# Patient Record
Sex: Male | Born: 1970 | Race: Black or African American | Hispanic: No | Marital: Single | Smoking: Current every day smoker
Health system: Southern US, Community
[De-identification: ages and names within clinical notes are randomized; demographics above are authoritative.]

## PROBLEM LIST (undated history)

## (undated) DIAGNOSIS — N183 Chronic kidney disease, stage 3 unspecified: Secondary | ICD-10-CM

## (undated) DIAGNOSIS — L409 Psoriasis, unspecified: Secondary | ICD-10-CM

## (undated) DIAGNOSIS — B1920 Unspecified viral hepatitis C without hepatic coma: Secondary | ICD-10-CM

## (undated) DIAGNOSIS — N186 End stage renal disease: Secondary | ICD-10-CM

## (undated) DIAGNOSIS — E785 Hyperlipidemia, unspecified: Secondary | ICD-10-CM

## (undated) DIAGNOSIS — E119 Type 2 diabetes mellitus without complications: Secondary | ICD-10-CM

## (undated) DIAGNOSIS — I10 Essential (primary) hypertension: Secondary | ICD-10-CM

## (undated) DIAGNOSIS — Z992 Dependence on renal dialysis: Secondary | ICD-10-CM

## (undated) DIAGNOSIS — K219 Gastro-esophageal reflux disease without esophagitis: Secondary | ICD-10-CM

## (undated) HISTORY — DX: Chronic kidney disease, stage 3 unspecified: N18.30

## (undated) HISTORY — PX: EYE SURGERY: SHX253

## (undated) HISTORY — DX: Hyperlipidemia, unspecified: E78.5

---

## 2013-12-04 ENCOUNTER — Encounter (HOSPITAL_COMMUNITY): Payer: Self-pay | Admitting: Emergency Medicine

## 2013-12-04 ENCOUNTER — Emergency Department (HOSPITAL_COMMUNITY)
Admission: EM | Admit: 2013-12-04 | Discharge: 2013-12-04 | Disposition: A | Discharge status: Home / Self Care | Payer: Self-pay | Attending: Emergency Medicine | Admitting: Emergency Medicine

## 2013-12-04 DIAGNOSIS — L03211 Cellulitis of face: Secondary | ICD-10-CM | POA: Insufficient documentation

## 2013-12-04 DIAGNOSIS — Z8619 Personal history of other infectious and parasitic diseases: Secondary | ICD-10-CM | POA: Insufficient documentation

## 2013-12-04 DIAGNOSIS — L0201 Cutaneous abscess of face: Secondary | ICD-10-CM | POA: Insufficient documentation

## 2013-12-04 DIAGNOSIS — F17200 Nicotine dependence, unspecified, uncomplicated: Secondary | ICD-10-CM | POA: Insufficient documentation

## 2013-12-04 DIAGNOSIS — I10 Essential (primary) hypertension: Secondary | ICD-10-CM | POA: Insufficient documentation

## 2013-12-04 HISTORY — DX: Essential (primary) hypertension: I10

## 2013-12-04 HISTORY — DX: Unspecified viral hepatitis C without hepatic coma: B19.20

## 2013-12-04 HISTORY — DX: Psoriasis, unspecified: L40.9

## 2013-12-04 MED ORDER — IBUPROFEN 800 MG PO TABS
800.0000 mg | ORAL_TABLET | Freq: Once | ORAL | Status: AC
  Administered 2013-12-04: 800 mg via ORAL
  Filled 2013-12-04: qty 1

## 2013-12-04 MED ORDER — AMOXICILLIN 500 MG PO CAPS: ORAL_CAPSULE | ORAL | Status: DC

## 2013-12-04 MED ORDER — CEFTRIAXONE SODIUM 1 G IJ SOLR
1.0000 g | Freq: Once | INTRAMUSCULAR | Status: AC
  Administered 2013-12-04: 1 g via INTRAMUSCULAR
  Filled 2013-12-04: qty 10

## 2013-12-04 MED ORDER — HYDROCODONE-ACETAMINOPHEN 5-325 MG PO TABS
2.0000 | ORAL_TABLET | Freq: Once | ORAL | Status: AC
  Administered 2013-12-04: 2 via ORAL
  Filled 2013-12-04: qty 2

## 2013-12-04 MED ORDER — LIDOCAINE HCL (PF) 1 % IJ SOLN
INTRAMUSCULAR | Status: AC
  Administered 2013-12-04: 5 mL
  Filled 2013-12-04: qty 5

## 2013-12-04 MED ORDER — LIDOCAINE HCL (PF) 1 % IJ SOLN
INTRAMUSCULAR | Status: AC
  Administered 2013-12-04: 2.1 mL
  Filled 2013-12-04: qty 5

## 2013-12-04 MED ORDER — ONDANSETRON 4 MG PO TBDP
4.0000 mg | ORAL_TABLET | Freq: Once | ORAL | Status: AC
  Administered 2013-12-04: 4 mg via ORAL
  Filled 2013-12-04: qty 1

## 2013-12-04 MED ORDER — SULFAMETHOXAZOLE-TRIMETHOPRIM 800-160 MG PO TABS: 1.0000 | ORAL_TABLET | Freq: Two times a day (BID) | ORAL | Status: DC

## 2013-12-04 MED ORDER — SULFAMETHOXAZOLE-TMP DS 800-160 MG PO TABS
1.0000 | ORAL_TABLET | Freq: Once | ORAL | Status: AC
  Administered 2013-12-04: 1 via ORAL
  Filled 2013-12-04: qty 1

## 2013-12-04 MED ORDER — HYDROCODONE-ACETAMINOPHEN 5-325 MG PO TABS: 1.0000 | ORAL_TABLET | Freq: Four times a day (QID) | ORAL | Status: DC | PRN

## 2013-12-04 NOTE — ED Notes (Signed)
Pt has been self medicating with antibiotics purchased off the street, unsure of antibiotic name or strength

## 2013-12-04 NOTE — ED Notes (Signed)
Pt noticed abscess on lt side of cheek 2 days ago, abscess burst 1 day ago and pt states the abscess has tripled in size over night.

## 2013-12-04 NOTE — ED Notes (Signed)
Pt has golf ball sized abscess to left cheek x 3 days total.  Reports it burst yesterday and then increased in size today.

## 2013-12-04 NOTE — ED Provider Notes (Addendum)
CSN: [id number]     Arrival date & time 12/04/13  [id number] History   First MD Initiated Contact with Patient 12/04/13 [phone]     Chief Complaint  Patient presents with  . Abscess    lt cheek   (Consider location/radiation/quality/duration/timing/severity/associated sxs/prior Treatment) Patient is a 42 y.o. male presenting with abscess. The history is provided by the patient.  Abscess Location:  Face Facial abscess location:  Face Abscess quality: painful and warmth   Abscess quality: not draining   Red streaking: no   Duration:  2 days Progression:  Worsening Pain details:    Quality:  Sharp and tightness   Severity:  Moderate   Duration:  2 days   Progression:  Worsening Chronicity:  New Context: not diabetes, not immunosuppression and not insect bite/sting   Relieved by:  Nothing Worsened by:  Draining/squeezing Associated symptoms: no fever, no nausea and no vomiting   Risk factors: no hx of MRSA   Risk factors comment:  Hepatitis C   Past Medical History  Diagnosis Date  . Hypertension   . Hepatitis C   . Psoriasis    History reviewed. No pertinent past surgical history. History reviewed. No pertinent family history. History  Substance Use Topics  . Smoking status: Current Every Day Smoker -- 0.50 packs/day    Types: Cigarettes  . Smokeless tobacco: Not on file  . Alcohol Use: No    Review of Systems  Constitutional: Negative for fever and activity change.       All ROS Neg except as noted in HPI  HENT: Negative for nosebleeds.   Eyes: Negative for photophobia and discharge.  Respiratory: Negative for cough, shortness of breath and wheezing.   Cardiovascular: Negative for chest pain and palpitations.  Gastrointestinal: Negative for nausea, vomiting, abdominal pain and blood in stool.  Genitourinary: Negative for dysuria, frequency and hematuria.  Musculoskeletal: Negative for arthralgias, back pain and neck pain.  Skin: Negative.   Neurological: Negative for  dizziness, seizures and speech difficulty.  Psychiatric/Behavioral: Negative for hallucinations and confusion.    Allergies  Other  Home Medications  No current outpatient prescriptions on file. BP 135/103  Pulse 80  Temp(Src) 98.2 F (36.8 C) (Oral)  Resp 20  Ht 5\' 11"  (1.803 m)  Wt 225 lb (102.059 kg)  BMI 31.39 kg/m2  SpO2 98% Physical Exam  Nursing note and vitals reviewed. Constitutional: He is oriented to person, place, and time. He appears well-developed and well-nourished.  Non-toxic appearance.  HENT:  Head: Normocephalic.    Right Ear: Tympanic membrane and external ear normal.  Left Ear: Tympanic membrane and external ear normal.  Eyes: EOM and lids are normal. Pupils are equal, round, and reactive to light.  Neck: Normal range of motion. Neck supple. Carotid bruit is not present.  Cardiovascular: Normal rate, regular rhythm, normal heart sounds, intact distal pulses and normal pulses.   Pulmonary/Chest: Breath sounds normal. No respiratory distress.  Abdominal: Soft. Bowel sounds are normal. There is no tenderness. There is no guarding.  Musculoskeletal: Normal range of motion.  Lymphadenopathy:       Head (right side): No submandibular adenopathy present.       Head (left side): No submandibular adenopathy present.    He has no cervical adenopathy.  Neurological: He is alert and oriented to person, place, and time. He has normal strength. No cranial nerve deficit or sensory deficit.  Skin: Skin is warm and dry.  Psychiatric: He has a normal mood and affect.  His speech is normal.    ED Course  INCISION AND DRAINAGE Date/Time: 12/04/2013 10:25 AM Performed by: Lenox Ahr Authorized by: Lenox Ahr Consent: Verbal consent obtained. Risks and benefits: risks, benefits and alternatives were discussed Consent given by: patient Patient understanding: patient states understanding of the procedure being performed Patient identity confirmed: arm  band Time out: Immediately prior to procedure a "time out" was called to verify the correct patient, procedure, equipment, support staff and site/side marked as required. Type: abscess Body area: head/neck Location details: face Anesthesia: local infiltration Local anesthetic: lidocaine 1% without epinephrine Patient sedated: no Scalpel size: 11 Incision type: single straight Complexity: simple Drainage: purulent Drainage amount: copious Wound treatment: wound left open Patient tolerance: Patient tolerated the procedure well with no immediate complications. Comments: Sterile dressing applied by me.   (including critical care time) Labs Review Labs Reviewed - No data to display Imaging Review No results found.  EKG Interpretation   None       MDM  No diagnosis found. **I have reviewed nursing notes, vital signs, and all appropriate lab and imaging results for this patient.*  Blood pressure 135/103. Pt advised to see primary MD or the Health Dept for recheck of the blood pressure. I and D  of the abscess of left face was carried out. Culture of abscess sent to the lab.  Pt placed on amoxil, septra, and norco. Pt to apply warm compresses to the face daily. He will return if any problems arise.  Lenox Ahr, PA-C 12/04/13 [place], [place] 12/17/13 [phone]

## 2013-12-06 LAB — CULTURE, ROUTINE-ABSCESS

## 2013-12-06 NOTE — ED Provider Notes (Signed)
Medical screening examination/treatment/procedure(s) were conducted as a shared visit with non-physician practitioner(s) and myself.  I personally evaluated the patient during the encounter.  EKG Interpretation   None      Left facial abscess. Drained in ER. Home with abx. Some bleeding after I and D controlled with pressure.    Hoy Morn, MD 12/06/13 [phone]

## 2013-12-07 ENCOUNTER — Telehealth (HOSPITAL_COMMUNITY): Payer: Self-pay | Admitting: Emergency Medicine

## 2013-12-07 NOTE — ED Notes (Signed)
Post ED Visit - Positive Culture Follow-up  Culture report reviewed by antimicrobial stewardship pharmacist: []  [place]es [place], Pharm.D., BCPS []  Heide Guile, Pharm.D., BCPS []  Alycia Rossetti, Pharm.D., BCPS []  [place], Pharm.D., BCPS, AAHIVP []  Legrand Como, Pharm.D., BCPS, AAHIVP [x]  Nena Jordan, Pharm.D., BCPS  Positive abscess culture Treated with Sulfa-Trimeth, organism sensitive to the same and no further patient follow-up is required at this time.  Kylie A Holland 12/07/2013, 3:18 PM

## 2013-12-17 NOTE — ED Provider Notes (Signed)
Medical screening examination/treatment/procedure(s) were conducted as a shared visit with non-physician practitioner(s) and myself.  I personally evaluated the patient during the encounter.  EKG Interpretation   None       Please see my other note  Hoy Morn, MD 12/17/13 2104

## 2019-04-17 ENCOUNTER — Emergency Department (HOSPITAL_COMMUNITY)
Admission: EM | Admit: 2019-04-17 | Discharge: 2019-04-17 | Disposition: A | Discharge status: Home / Self Care | Payer: Self-pay | Attending: Emergency Medicine | Admitting: Emergency Medicine

## 2019-04-17 ENCOUNTER — Other Ambulatory Visit: Payer: Self-pay

## 2019-04-17 ENCOUNTER — Encounter (HOSPITAL_COMMUNITY): Payer: Self-pay

## 2019-04-17 DIAGNOSIS — F17210 Nicotine dependence, cigarettes, uncomplicated: Secondary | ICD-10-CM | POA: Insufficient documentation

## 2019-04-17 DIAGNOSIS — I10 Essential (primary) hypertension: Secondary | ICD-10-CM | POA: Insufficient documentation

## 2019-04-17 DIAGNOSIS — E119 Type 2 diabetes mellitus without complications: Secondary | ICD-10-CM | POA: Insufficient documentation

## 2019-04-17 DIAGNOSIS — L02811 Cutaneous abscess of head [any part, except face]: Secondary | ICD-10-CM | POA: Insufficient documentation

## 2019-04-17 DIAGNOSIS — Z7984 Long term (current) use of oral hypoglycemic drugs: Secondary | ICD-10-CM | POA: Insufficient documentation

## 2019-04-17 DIAGNOSIS — L0291 Cutaneous abscess, unspecified: Secondary | ICD-10-CM

## 2019-04-17 HISTORY — DX: Type 2 diabetes mellitus without complications: E11.9

## 2019-04-17 LAB — BASIC METABOLIC PANEL
Anion gap: 13 (ref 5–15)
BUN: 13 mg/dL (ref 6–20)
CO2: 25 mmol/L (ref 22–32)
Calcium: 9 mg/dL (ref 8.9–10.3)
Chloride: 97 mmol/L — ABNORMAL LOW (ref 98–111)
Creatinine, Ser: 0.94 mg/dL (ref 0.61–1.24)
GFR calc Af Amer: >60 mL/min (ref >60)
GFR calc non Af Amer: >60 mL/min (ref >60)
Glucose, Bld: 262 mg/dL — ABNORMAL HIGH (ref 70–99)
Potassium: 3.4 mmol/L — ABNORMAL LOW (ref 3.5–5.1)
Sodium: 135 mmol/L (ref 135–145)

## 2019-04-17 LAB — CBC WITH DIFFERENTIAL/PLATELET
Abs Immature Granulocytes: 0.15 10*3/uL — ABNORMAL HIGH (ref 0.00–0.07)
Basophils Absolute: 0.1 10*3/uL (ref 0.0–0.1)
Basophils Relative: 0 %
Eosinophils Absolute: 0.1 10*3/uL (ref 0.0–0.5)
Eosinophils Relative: 0 %
HCT: 48 % (ref 39.0–52.0)
Hemoglobin: 16.3 g/dL (ref 13.0–17.0)
Immature Granulocytes: 1 %
Lymphocytes Relative: 16 %
Lymphs Abs: 3.9 10*3/uL (ref 0.7–4.0)
MCH: 30.5 pg (ref 26.0–34.0)
MCHC: 34 g/dL (ref 30.0–36.0)
MCV: 89.7 fL (ref 80.0–100.0)
Monocytes Absolute: 1.5 10*3/uL — ABNORMAL HIGH (ref 0.1–1.0)
Monocytes Relative: 6 %
Neutro Abs: 18.2 10*3/uL — ABNORMAL HIGH (ref 1.7–7.7)
Neutrophils Relative %: 77 %
Platelets: 347 10*3/uL (ref 150–400)
RBC: 5.35 MIL/uL (ref 4.22–5.81)
RDW: 13.4 % (ref 11.5–15.5)
WBC: 23.9 10*3/uL — ABNORMAL HIGH (ref 4.0–10.5)
nRBC: 0 % (ref 0.0–0.2)

## 2019-04-17 LAB — URINALYSIS, ROUTINE W REFLEX MICROSCOPIC
Glucose, UA: 50 mg/dL — AB
Ketones, ur: 5 mg/dL — AB
Leukocytes,Ua: NEGATIVE
Nitrite: NEGATIVE
Protein, ur: 100 mg/dL — AB
Specific Gravity, Urine: 1.034 — ABNORMAL HIGH (ref 1.005–1.030)
pH: 5 (ref 5.0–8.0)

## 2019-04-17 MED ORDER — SODIUM CHLORIDE 0.9 % IV BOLUS
1000.0000 mL | Freq: Once | INTRAVENOUS | Status: AC
  Administered 2019-04-17: 11:00:00 1000 mL via INTRAVENOUS

## 2019-04-17 MED ORDER — DOXYCYCLINE HYCLATE 100 MG PO CAPS: 100.0000 mg | ORAL_CAPSULE | Freq: Two times a day (BID) | ORAL | 0 refills | Status: DC

## 2019-04-17 MED ORDER — LIDOCAINE HCL (PF) 1 % IJ SOLN
5.0000 mL | Freq: Once | INTRAMUSCULAR | Status: DC
  Filled 2019-04-17: qty 6

## 2019-04-17 MED ORDER — CEPHALEXIN 500 MG PO CAPS: 500.0000 mg | ORAL_CAPSULE | Freq: Four times a day (QID) | ORAL | 0 refills | Status: DC

## 2019-04-17 NOTE — ED Triage Notes (Signed)
Pt reports abscess to back of head for 8 days. Reports it has drained twice and then returns. Makes neck hurt

## 2019-04-17 NOTE — ED Provider Notes (Signed)
[facility] EMERGENCY DEPARTMENT Provider Note   CSN: [id number] Arrival date & time: 04/17/19  9476    History   Chief Complaint Chief Complaint  Patient presents with  . Abscess    HPI Jesse Nguyen is a 48 y.o. male with a PMH of Diabetes, Hepatitic C, HTN, and Psoriasis presenting with an abscess on the right posterior aspect of his head onset 8 days ago. Patient reports abscess has been draining on its own. Patient reports taking aspirin and ibuprofen with partial relief. Patient reports he took aspirin at 6am today. Patient describes pain as a constant ache. Patient states he has had an abscess on his face years ago.  Patient reports a subjective fever and nausea. Patient denies chills, vomiting, abdominal pain, ear pain, neck pain, or weakness. Patient reports he has been taking his oral diabetes medications as prescribed. Patient reports he does not take any medications of his Hepatitis C. Patient reports he has follow up with care with the health department.     HPI  Past Medical History:  Diagnosis Date  . Diabetes mellitus without complication ([place])   . Hepatitis C   . Hypertension   . Psoriasis     There are no active problems to display for this patient.   Past Surgical History:  Procedure Laterality Date  . EYE SURGERY          Home Medications    Prior to Admission medications   Medication Sig Start Date End Date Taking? Authorizing Provider  metFORMIN (GLUCOPHAGE) 1000 MG tablet Take 1,000 mg by mouth 2 (two) times daily with a meal.   Yes [provider]  cephALEXin (KEFLEX) 500 MG capsule Take 1 capsule (500 mg total) by mouth 4 (four) times daily. 04/17/19   Darlin Drop P, PA-C  doxycycline (VIBRAMYCIN) 100 MG capsule Take 1 capsule (100 mg total) by mouth 2 (two) times daily. 04/17/19   Arville Lime, PA-C    Family History No family history on file.  Social History Social History   Tobacco Use  . Smoking status: Current Every  Day Smoker    Packs/day: 0.50    Types: Cigarettes  Substance Use Topics  . Alcohol use: No  . Drug use: Not Currently    Frequency: 7.0 times per week    Types: Marijuana     Allergies   Other   Review of Systems Review of Systems  Constitutional: Positive for fever. Negative for chills and diaphoresis.  HENT: Negative for ear pain, sore throat and trouble swallowing.   Respiratory: Negative for cough and shortness of breath.   Cardiovascular: Negative for chest pain.  Gastrointestinal: Positive for nausea. Negative for abdominal pain and vomiting.  Endocrine: Negative for polydipsia, polyphagia and polyuria.  Genitourinary: Negative for dysuria and frequency.  Musculoskeletal: Negative for neck pain and neck stiffness.  Skin: Positive for color change and wound. Negative for rash.  Allergic/Immunologic: Positive for immunocompromised state.  Neurological: Negative for weakness and headaches.  Hematological: Negative for adenopathy.   Physical Exam Updated Vital Signs BP (!) 153/101   Pulse 88   Temp 98.2 F (36.8 C) (Oral)   Resp 16   Ht 6' (1.829 m)   Wt 89.4 kg   SpO2 100%   BMI 26.72 kg/m   Physical Exam Vitals signs and nursing note reviewed.  Constitutional:      General: He is not in acute distress.    Appearance: He is well-developed. He is not diaphoretic.  HENT:     Head: Normocephalic and atraumatic.     Right Ear: Tympanic membrane, ear canal and external ear normal.     Left Ear: Tympanic membrane, ear canal and external ear normal.     Nose: Nose normal.     Mouth/Throat:     Mouth: Mucous membranes are moist.  Eyes:     Conjunctiva/sclera: Conjunctivae normal.  Neck:     Musculoskeletal: Normal range of motion. No neck rigidity or muscular tenderness.  Cardiovascular:     Rate and Rhythm: Normal rate and regular rhythm.     Heart sounds: Normal heart sounds. No murmur. No friction rub. No gallop.   Pulmonary:     Effort: Pulmonary effort  is normal. No respiratory distress.     Breath sounds: Normal breath sounds. No wheezing or rales.  Abdominal:     Palpations: Abdomen is soft.     Tenderness: There is no abdominal tenderness.  Musculoskeletal: Normal range of motion.  Skin:    General: Skin is warm.     Findings: Abscess (Abscess noted on the right posterior aspect of head. Abscess is actively draining on exam. Mild tenderness to palpation. Mild surrounding erythema noted. See picture for more details.) and erythema present. No rash.  Neurological:     Mental Status: He is alert.       ED Treatments / Results  Labs (all labs ordered are listed, but only abnormal results are displayed) Labs Reviewed  CBC WITH DIFFERENTIAL/PLATELET - Abnormal; Notable for the following components:      Result Value   WBC 23.9 (*)    Neutro Abs 18.2 (*)    Monocytes Absolute 1.5 (*)    Abs Immature Granulocytes 0.15 (*)    All other components within normal limits  URINALYSIS, ROUTINE W REFLEX MICROSCOPIC - Abnormal; Notable for the following components:   Color, Urine AMBER (*)    Specific Gravity, Urine 1.034 (*)    Glucose, UA 50 (*)    Hgb urine dipstick SMALL (*)    Bilirubin Urine SMALL (*)    Ketones, ur 5 (*)    Protein, ur 100 (*)    Bacteria, UA RARE (*)    All other components within normal limits  BASIC METABOLIC PANEL - Abnormal; Notable for the following components:   Potassium 3.4 (*)    Chloride 97 (*)    Glucose, Bld 262 (*)    All other components within normal limits  URINE CULTURE    EKG None  Radiology No results found.  Procedures .Marland KitchenIncision and Drainage Date/Time: 04/17/2019 11:40 AM Performed by: Arville Lime, PA-C Authorized by: Arville Lime, PA-C   Consent:    Consent obtained:  Verbal   Consent given by:  Patient   Risks discussed:  Bleeding, incomplete drainage, pain and damage to other organs   Alternatives discussed:  No treatment Universal protocol:    Procedure  explained and questions answered to patient or proxy's satisfaction: yes     Relevant documents present and verified: yes     Test results available and properly labeled: yes     Imaging studies available: yes     Required blood products, implants, devices, and special equipment available: yes     Site/side marked: yes     Immediately prior to procedure a time out was called: yes     Patient identity confirmed:  Verbally with patient Location:    Type:  Abscess   Size:  3cm  Location:  Head   Head location:  Scalp Pre-procedure details:    Skin preparation:  Betadine Anesthesia (see MAR for exact dosages):    Anesthesia method:  Local infiltration   Local anesthetic:  Lidocaine 1% w/o epi Procedure type:    Complexity:  Simple Procedure details:    Incision types:  Single straight   Incision depth:  Subcutaneous   Scalpel blade:  11   Wound management:  Probed and deloculated, irrigated with saline and extensive cleaning   Drainage:  Purulent and bloody   Drainage amount:  Moderate   Packing materials:  None Post-procedure details:    Patient tolerance of procedure:  Tolerated well, no immediate complications   (including critical care time)  Medications Ordered in ED Medications  lidocaine (PF) (XYLOCAINE) 1 % injection 5 mL (has no administration in time range)  sodium chloride 0.9 % bolus 1,000 mL (1,000 mLs Intravenous New Bag/Given 04/17/19 1051)     Initial Impression / Assessment and Plan / ED Course  I have reviewed the triage vital signs and the nursing notes.  Pertinent labs & imaging results that were available during my care of the patient were reviewed by me and considered in my medical decision making (see chart for details).  Clinical Course as of Apr 16 1140  Wed Apr 17, 2019  1139 Leukocytosis noted at 23.9.  WBC(!): 23.9 [AH]  1139 BMP remarkable for mild hypokalemia at 3.4, low chloride at 97, and hyperglycemia at 262. No anion gap.   Basic  metabolic panel(!) [AH]    Clinical Course User Index [AH] Arville Lime, PA-C       Patient presents with an abscess. Patient with skin abscess amenable to incision and drainage.  Abscess was not large enough to warrant packing or drain. Labs and vitals reviewed. Will advise a wound recheck in 2 days. Encouraged home warm soaks and flushing.  Mild signs of cellulitis is surrounding skin.  Will prescribe antibiotics due to history of diabetes and leukocytosis. Will d/c to home.   Findings and plan of care discussed with supervising physician Dr. Alvino Chapel.  Final Clinical Impressions(s) / ED Diagnoses   Final diagnoses:  Abscess    ED Discharge Orders         Ordered    doxycycline (VIBRAMYCIN) 100 MG capsule  2 times daily     04/17/19 1137    cephALEXin (KEFLEX) 500 MG capsule  4 times daily     04/17/19 [address] [place], [place] 04/17/19 1142    Davonna Belling, MD 04/17/19 [phone]

## 2019-04-17 NOTE — ED Notes (Signed)
Dried abscessed area noted to back of head

## 2019-04-17 NOTE — Discharge Instructions (Addendum)
You have been seen today for an abscess. Please read and follow all provided instructions.   1. Medications: doxycyline and keflex (antibiotic), usual home medications 2. Treatment: rest, drink plenty of fluids 3. Follow Up: Please follow up with your primary doctor for a wound check in 2 days and for discussion of your diagnoses and further evaluation after today's visit; if you do not have a primary care doctor use the resource guide provided to find one; Please return to the ER for any new or worsening symptoms. Please obtain all of your results from medical records or have your doctors office obtain the results - share them with your doctor - you should be seen at your doctors office. Call today to arrange your follow up.   Take medications as prescribed. Please review all of the medicines and only take them if you do not have an allergy to them. Return to the emergency room for worsening condition or new concerning symptoms. Follow up with your regular doctor. If you don't have a regular doctor use one of the numbers below to establish a primary care doctor.  Please be aware that if you are taking birth control pills, taking other prescriptions, ESPECIALLY ANTIBIOTICS may make the birth control ineffective - if this is the case, either do not engage in sexual activity or use alternative methods of birth control such as condoms until you have finished the medicine and your family doctor says it is OK to restart them. If you are on a blood thinner such as COUMADIN, be aware that any other medicine that you take may cause the coumadin to either work too much, or not enough - you should have your coumadin level rechecked in next 7 days if this is the case.  ?  It is also a possibility that you have an allergic reaction to any of the medicines that you have been prescribed - Everybody reacts differently to medications and while MOST people have no trouble with most medicines, you may have a reaction such  as nausea, vomiting, rash, swelling, shortness of breath. If this is the case, please stop taking the medicine immediately and contact your physician.  ?  You should return to the ER if you develop severe or worsening symptoms.   Emergency Department Resource Guide 1) Find a Doctor and Pay Out of Pocket Although you won't have to find out who is covered by your insurance plan, it is a good idea to ask around and get recommendations. You will then need to call the office and see if the doctor you have chosen will accept you as a new patient and what types of options they offer for patients who are self-pay. Some doctors offer discounts or will set up payment plans for their patients who do not have insurance, but you will need to ask so you aren't surprised when you get to your appointment.  2) Contact Your Local Health Department Not all health departments have doctors that can see patients for sick visits, but many do, so it is worth a call to see if yours does. If you don't know where your local health department is, you can check in your phone book. The CDC also has a tool to help you locate your state's health department, and many state websites also have listings of all of their local health departments.  3) Find a [facility] If your illness is not likely to be very severe or complicated, you may want to try a  walk in clinic. These are popping up all over the country in pharmacies, drugstores, and shopping centers. They're usually staffed by nurse practitioners or physician assistants that have been trained to treat common illnesses and complaints. They're usually fairly quick and inexpensive. However, if you have serious medical issues or chronic medical problems, these are probably not your best option.  No Primary Care Doctor: Call Health Connect at  [phone] - they can help you locate a primary care doctor that  accepts your insurance, provides certain services, etc. Physician Referral  Service- [phone]  Chronic Pain Problems: Organization         Address  Phone   Notes  [facility]  [phone] Patients need to be referred by their primary care doctor.   Medication Assistance: Organization         Address  Phone   Notes  [facility] Medication [facility] [address] [place], [place] [address] [phone] --Must be a resident of [facility] -- Must have NO insurance coverage whatsoever (no Medicaid/ Medicare, etc.) -- The pt. MUST have a primary care doctor that directs their care regularly and follows them in the community   [facility]  [phone]   [employer]  [phone]    Agencies that provide inexpensive medical care: Organization         Address  Phone   Notes  [place]  [phone]   [facility]    [phone]   [facility] [place], [place] [address] [phone]   [place] [address] [place] [phone]   [facility]    [phone]   [facility]    [phone]   [place] [address] [place] [address], [place] Phone:  [phone], Fax:  [phone] Hours of Operation:  9 am - 6 pm, M-F.  Also accepts Medicaid/Medicare and self-pay.  [facility] for [address] [place], [address], [place] Phone: [phone], Fax: [phone]. Hours of Operation:  8:30 am - 5:30 pm, M-F.  Also accepts Medicaid and self-pay.  [facility] [address] [place] Phone: [phone]   [place], [place], [place] [phone], [phone] Mondays & Thursdays: 7-9 AM.  First 15 patients are seen on a first come, first serve basis.    [place] Providers:  Organization         Address  Phone   Notes  [facility] [address], [place] [phone] Also accepts self-pay patients.  [facility] [address], [place]  [phone]   [place], [address], [place] [phone]   [facility] [address], [place] [phone]   Lucianne Lei [address], [place]   [phone] Only accepts [place] [facility] [place] patients after they have their name applied to their card.   Self-Pay (no insurance) in [facility]:  Organization         Address  Phone   Notes  Sickle Cell Patients, [facility] [place] [phone]   [facility] [place] [phone]   [facility] [place]  [address], [address], [place] [phone]   [facility]/Dr. Osei-Bonsu  [address] [place] or [address], [place] [phone] Phone number for both [place] and [place] locations is the same.  [facility] [address] [place] [phone]   [facility] [address], [place] or [address] [phone]   [facility] [address] [place] [phone], phone; [phone], fax Sees patients 1st and 3rd Saturday of every month.  Must not qualify for public or private insurance (i.e. Medicaid, Medicare, [employer] Choice, Veterans' Benefits)  Household income should be no more than 200% of the poverty level The clinic cannot treat you if you are pregnant or think you are pregnant  Sexually transmitted diseases are not treated at the clinic.

## 2019-04-18 LAB — URINE CULTURE: Culture: NO GROWTH

## 2020-03-28 ENCOUNTER — Other Ambulatory Visit: Payer: Self-pay

## 2020-03-28 ENCOUNTER — Emergency Department (HOSPITAL_COMMUNITY)
Admission: EM | Admit: 2020-03-28 | Discharge: 2020-03-28 | Disposition: A | Discharge status: Home / Self Care | Payer: Self-pay | Attending: Emergency Medicine | Admitting: Emergency Medicine

## 2020-03-28 ENCOUNTER — Encounter (HOSPITAL_COMMUNITY): Payer: Self-pay

## 2020-03-28 ENCOUNTER — Emergency Department (HOSPITAL_COMMUNITY): Payer: Self-pay

## 2020-03-28 DIAGNOSIS — Z7984 Long term (current) use of oral hypoglycemic drugs: Secondary | ICD-10-CM | POA: Insufficient documentation

## 2020-03-28 DIAGNOSIS — Y9389 Activity, other specified: Secondary | ICD-10-CM | POA: Insufficient documentation

## 2020-03-28 DIAGNOSIS — Z9114 Patient's other noncompliance with medication regimen: Secondary | ICD-10-CM | POA: Insufficient documentation

## 2020-03-28 DIAGNOSIS — E1165 Type 2 diabetes mellitus with hyperglycemia: Secondary | ICD-10-CM | POA: Insufficient documentation

## 2020-03-28 DIAGNOSIS — S91102A Unspecified open wound of left great toe without damage to nail, initial encounter: Secondary | ICD-10-CM | POA: Insufficient documentation

## 2020-03-28 DIAGNOSIS — I10 Essential (primary) hypertension: Secondary | ICD-10-CM | POA: Insufficient documentation

## 2020-03-28 DIAGNOSIS — T148XXA Other injury of unspecified body region, initial encounter: Secondary | ICD-10-CM

## 2020-03-28 DIAGNOSIS — R739 Hyperglycemia, unspecified: Secondary | ICD-10-CM

## 2020-03-28 DIAGNOSIS — Y9289 Other specified places as the place of occurrence of the external cause: Secondary | ICD-10-CM | POA: Insufficient documentation

## 2020-03-28 DIAGNOSIS — E11628 Type 2 diabetes mellitus with other skin complications: Secondary | ICD-10-CM | POA: Insufficient documentation

## 2020-03-28 DIAGNOSIS — Y999 Unspecified external cause status: Secondary | ICD-10-CM | POA: Insufficient documentation

## 2020-03-28 DIAGNOSIS — X58XXXA Exposure to other specified factors, initial encounter: Secondary | ICD-10-CM | POA: Insufficient documentation

## 2020-03-28 LAB — CBC WITH DIFFERENTIAL/PLATELET
Abs Immature Granulocytes: 0.09 10*3/uL — ABNORMAL HIGH (ref 0.00–0.07)
Basophils Absolute: 0.1 10*3/uL (ref 0.0–0.1)
Basophils Relative: 1 %
Eosinophils Absolute: 0.2 10*3/uL (ref 0.0–0.5)
Eosinophils Relative: 2 %
HCT: 40.2 % (ref 39.0–52.0)
Hemoglobin: 13.3 g/dL (ref 13.0–17.0)
Immature Granulocytes: 1 %
Lymphocytes Relative: 27 %
Lymphs Abs: 2.5 10*3/uL (ref 0.7–4.0)
MCH: 30.6 pg (ref 26.0–34.0)
MCHC: 33.1 g/dL (ref 30.0–36.0)
MCV: 92.6 fL (ref 80.0–100.0)
Monocytes Absolute: 0.7 10*3/uL (ref 0.1–1.0)
Monocytes Relative: 8 %
Neutro Abs: 5.7 10*3/uL (ref 1.7–7.7)
Neutrophils Relative %: 61 %
Platelets: 328 10*3/uL (ref 150–400)
RBC: 4.34 MIL/uL (ref 4.22–5.81)
RDW: 13 % (ref 11.5–15.5)
WBC: 9.2 10*3/uL (ref 4.0–10.5)
nRBC: 0 % (ref 0.0–0.2)

## 2020-03-28 LAB — BASIC METABOLIC PANEL
Anion gap: 6 (ref 5–15)
BUN: 12 mg/dL (ref 6–20)
CO2: 28 mmol/L (ref 22–32)
Calcium: 9 mg/dL (ref 8.9–10.3)
Chloride: 101 mmol/L (ref 98–111)
Creatinine, Ser: 0.77 mg/dL (ref 0.61–1.24)
GFR calc Af Amer: >60 mL/min (ref >60)
GFR calc non Af Amer: >60 mL/min (ref >60)
Glucose, Bld: 343 mg/dL — ABNORMAL HIGH (ref 70–99)
Potassium: 4.3 mmol/L (ref 3.5–5.1)
Sodium: 135 mmol/L (ref 135–145)

## 2020-03-28 MED ORDER — CLINDAMYCIN HCL 150 MG PO CAPS
300.0000 mg | ORAL_CAPSULE | Freq: Once | ORAL | Status: AC
  Administered 2020-03-28: 16:00:00 300 mg via ORAL
  Filled 2020-03-28: qty 2

## 2020-03-28 MED ORDER — CLINDAMYCIN HCL 150 MG PO CAPS: 300.0000 mg | ORAL_CAPSULE | Freq: Four times a day (QID) | ORAL | 0 refills | Status: DC

## 2020-03-28 MED ORDER — PREDNISOLONE SODIUM PHOSPHATE 15 MG/5ML PO SOLN
24.0000 mg | Freq: Once | ORAL | Status: AC
  Administered 2020-03-28: 24 mg via ORAL
  Filled 2020-03-28: qty 2

## 2020-03-28 NOTE — ED Notes (Signed)
CBG [phone]

## 2020-03-28 NOTE — ED Notes (Signed)
Pt is a noncompliant diabetic. Pt does not take BP medicine.

## 2020-03-28 NOTE — ED Triage Notes (Signed)
Pt has had foot swelling for the last 2 weeks. States "my foot is busted open and I took some antibiotics and Aspirin. I'm not sure which antibiotic." Pt is ambulatory.

## 2020-03-28 NOTE — Discharge Instructions (Addendum)
As discussed, your blood sugar is elevated today.  It is important that you take your Metformin when you return home and also take it twice daily as directed.  You have a nonhealing wound of your left great toe that is likely secondary to your diabetes.  Clean the wound with mild soap and water and avoid using hydrogen peroxide on the wound.  Keep the area bandaged.  Take the antibiotic as directed.  Return here on Monday or Tuesday for a recheck of your foot.

## 2020-03-28 NOTE — ED Provider Notes (Signed)
[facility] EMERGENCY DEPARTMENT Provider Note   CSN: [id number] Arrival date & time: 03/28/20  1318     History No chief complaint on file.   Jesse Nguyen is a 49 y.o. male.  HPI      Jesse Nguyen is a 49 y.o. male with past medical history significant for diabetes, hepatitis C, hypertension and psoriasis.  He presents to the Emergency Department complaining of swelling and a nonhealing wound of his left lateral great toe.  Symptoms have been present for 2 weeks.  He is unsure how the wound occurred, but states that he noticed a small area that "busted open" he has been taking unknown antibiotic that he had leftover from a prior prescription and cleaning the wound with water and peroxide.  He denies pain to the left foot but describes an aching pain into his anterior lower leg that worsens with weightbearing.  He admits that he is noncompliant with taking his prescribed medications, last dose of his Glucophage was 3 days ago.  Denies fever, chills, chest pain, abdominal pain or shortness of breath.  No alleviating factors.   Past Medical History:  Diagnosis Date  . Diabetes mellitus without complication ([place])   . Hepatitis C   . Hypertension   . Psoriasis     There are no problems to display for this patient.   Past Surgical History:  Procedure Laterality Date  . EYE SURGERY       No family history on file.  Social History   Tobacco Use  . Smoking status: Current Every Day Smoker    Packs/day: 0.50    Types: Cigarettes  . Smokeless tobacco: Never Used  Substance Use Topics  . Alcohol use: No  . Drug use: Not Currently    Frequency: 7.0 times per week    Types: Marijuana    Home Medications Prior to Admission medications   Medication Sig Start Date End Date Taking? Authorizing Provider  cephALEXin (KEFLEX) 500 MG capsule Take 1 capsule (500 mg total) by mouth 4 (four) times daily. 04/17/19   Darlin Drop P, PA-C  doxycycline (VIBRAMYCIN) 100 MG capsule Take  1 capsule (100 mg total) by mouth 2 (two) times daily. 04/17/19   Darlin Drop P, PA-C  metFORMIN (GLUCOPHAGE) 1000 MG tablet Take 1,000 mg by mouth 2 (two) times daily with a meal.    [provider]    Allergies    Other  Review of Systems   Review of Systems  Constitutional: Negative for chills and fever.  Respiratory: Negative for shortness of breath.   Cardiovascular: Negative for chest pain.  Gastrointestinal: Negative for abdominal pain, diarrhea, nausea and vomiting.  Musculoskeletal: Positive for arthralgias (swelling, non-healing wound to left great toe.  ) and joint swelling.  Skin: Positive for color change and wound.  Neurological: Negative for dizziness, syncope, weakness and numbness.    Physical Exam Updated Vital Signs BP (!) 170/93 (BP Location: Left Arm)   Pulse 77   Temp 98.5 F (36.9 C) (Oral)   Resp 14   Ht 6' (1.829 m)   Wt 93 kg   SpO2 100%   BMI 27.80 kg/m   Physical Exam Vitals and nursing note reviewed.  Constitutional:      General: He is not in acute distress.    Appearance: Normal appearance. He is well-developed. He is not toxic-appearing.  HENT:     Head: Normocephalic.  Cardiovascular:     Rate and Rhythm: Normal rate and regular rhythm.  Pulses: Normal pulses.  Pulmonary:     Effort: Pulmonary effort is normal. No respiratory distress.     Breath sounds: Normal breath sounds.  Chest:     Chest wall: No tenderness.  Abdominal:     Palpations: Abdomen is soft.     Tenderness: There is no abdominal tenderness.  Musculoskeletal:        General: Tenderness present. No signs of injury.     Cervical back: Normal range of motion and neck supple.     Left foot: Swelling and tenderness present.     Comments: Mild erythema of the distal left foot and great toe with eschar of the lateral left great toe mild dried blood noted to foot.  No active bleeding, no drainage.  See photo attached  Lymphadenopathy:     Cervical: No  cervical adenopathy.  Skin:    General: Skin is warm.     Capillary Refill: Capillary refill takes 2 to 3 seconds.     Findings: No rash.  Neurological:     General: No focal deficit present.     Mental Status: He is alert.     Sensory: No sensory deficit.     Motor: No weakness or abnormal muscle tone.       ED Results / Procedures / Treatments   Labs (all labs ordered are listed, but only abnormal results are displayed) Labs Reviewed  BASIC METABOLIC PANEL - Abnormal; Notable for the following components:      Result Value   Glucose, Bld 343 (*)    All other components within normal limits  CBC WITH DIFFERENTIAL/PLATELET - Abnormal; Notable for the following components:   Abs Immature Granulocytes 0.09 (*)    All other components within normal limits    EKG None  Radiology DG Foot Complete Left  Result Date: 03/28/2020 CLINICAL DATA:  Nonhealing wound first toe EXAM: LEFT FOOT - COMPLETE 3+ VIEW COMPARISON:  None. FINDINGS: Frontal, oblique, and lateral views were obtained. Soft tissue wound noted along the medial first digit. No radiopaque foreign body. No fracture or dislocation. No erosive change or bony destruction. Joint spaces appear normal. No erosive change. IMPRESSION: Soft tissue wound along the first digit region medially. No bony destruction. No fracture or dislocation. No appreciable arthropathy. Electronically Signed   By: Lowella Grip III M.D.   On: 03/28/2020 14:41    Procedures Procedures (including critical care time)  Medications Ordered in ED Medications  prednisoLONE (ORAPRED) 15 MG/5ML solution 24 mg (has no administration in time range)    ED Course  I have reviewed the triage vital signs and the nursing notes.  Pertinent labs & imaging results that were available during my care of the patient were reviewed by me and considered in my medical decision making (see chart for details).    MDM Rules/Calculators/A&P                       Patient who is a known diabetic and admits to being noncompliant with his medications.  He has a nonhealing wound of his left great toe.  Laboratory studies show hyperglycemia with blood glucose of 323, no leukocytosis.  Vital signs reviewed, no fever.  X-ray of the foot does not show evidence of osteomyelitis. He was given a single dose of orapred that was ordered for this patient in error.  He has been observed in the dept without complication and he was advised of this and I have discussed possibility  of elevated blood sugar and he agrees to monitor his blood sugar and take his antidiabetic medication after proper education of complications associated with poorly controlled diabetes.  he is neurovascularly intact and able to bear weight to the affected extremity.  No lymphangitis.  I feel that he is appropriate for discharge home.  I have instructed patient to clean the wound with mild soap and water, will start antibiotics and he agrees to close follow-up here for recheck of his toe in 2 to 3 days.    Final Clinical Impression(s) / ED Diagnoses Final diagnoses:  Nonhealing nonsurgical wound  Hyperglycemia    Rx / DC Orders ED Discharge Orders    None       Kem Parkinson, PA-C 03/28/20 1635    Noemi Chapel, MD 03/31/20 [phone]

## 2020-03-30 LAB — CBG MONITORING, ED: Glucose-Capillary: 302 mg/dL — ABNORMAL HIGH (ref 70–99)

## 2020-04-21 ENCOUNTER — Other Ambulatory Visit: Payer: Self-pay

## 2020-04-21 ENCOUNTER — Encounter (HOSPITAL_COMMUNITY): Payer: Self-pay | Admitting: *Deleted

## 2020-04-21 ENCOUNTER — Emergency Department (HOSPITAL_COMMUNITY)
Admission: EM | Admit: 2020-04-21 | Discharge: 2020-04-21 | Disposition: A | Discharge status: Home / Self Care | Payer: Self-pay | Attending: Emergency Medicine | Admitting: Emergency Medicine

## 2020-04-21 ENCOUNTER — Emergency Department (HOSPITAL_COMMUNITY): Payer: Self-pay

## 2020-04-21 ENCOUNTER — Other Ambulatory Visit: Payer: Self-pay | Admitting: Internal Medicine

## 2020-04-21 DIAGNOSIS — S91101A Unspecified open wound of right great toe without damage to nail, initial encounter: Secondary | ICD-10-CM

## 2020-04-21 DIAGNOSIS — I10 Essential (primary) hypertension: Secondary | ICD-10-CM | POA: Insufficient documentation

## 2020-04-21 DIAGNOSIS — L089 Local infection of the skin and subcutaneous tissue, unspecified: Secondary | ICD-10-CM | POA: Insufficient documentation

## 2020-04-21 DIAGNOSIS — F17210 Nicotine dependence, cigarettes, uncomplicated: Secondary | ICD-10-CM | POA: Insufficient documentation

## 2020-04-21 DIAGNOSIS — E11628 Type 2 diabetes mellitus with other skin complications: Secondary | ICD-10-CM | POA: Insufficient documentation

## 2020-04-21 DIAGNOSIS — Z7984 Long term (current) use of oral hypoglycemic drugs: Secondary | ICD-10-CM | POA: Insufficient documentation

## 2020-04-21 LAB — HEMOGLOBIN A1C
Hgb A1c MFr Bld: 12.4 % — ABNORMAL HIGH (ref 4.8–5.6)
Mean Plasma Glucose: 309.18 mg/dL

## 2020-04-21 LAB — BASIC METABOLIC PANEL
Anion gap: 9 (ref 5–15)
BUN: 10 mg/dL (ref 6–20)
CO2: 24 mmol/L (ref 22–32)
Calcium: 9.1 mg/dL (ref 8.9–10.3)
Chloride: 103 mmol/L (ref 98–111)
Creatinine, Ser: 0.73 mg/dL (ref 0.61–1.24)
GFR calc Af Amer: >60 mL/min (ref >60)
GFR calc non Af Amer: >60 mL/min (ref >60)
Glucose, Bld: 324 mg/dL — ABNORMAL HIGH (ref 70–99)
Potassium: 4.3 mmol/L (ref 3.5–5.1)
Sodium: 136 mmol/L (ref 135–145)

## 2020-04-21 LAB — CBC WITH DIFFERENTIAL/PLATELET
Abs Immature Granulocytes: 0.07 10*3/uL (ref 0.00–0.07)
Basophils Absolute: 0.1 10*3/uL (ref 0.0–0.1)
Basophils Relative: 0 %
Eosinophils Absolute: 0.1 10*3/uL (ref 0.0–0.5)
Eosinophils Relative: 1 %
HCT: 37.4 % — ABNORMAL LOW (ref 39.0–52.0)
Hemoglobin: 12.5 g/dL — ABNORMAL LOW (ref 13.0–17.0)
Immature Granulocytes: 1 %
Lymphocytes Relative: 22 %
Lymphs Abs: 2.6 10*3/uL (ref 0.7–4.0)
MCH: 30.7 pg (ref 26.0–34.0)
MCHC: 33.4 g/dL (ref 30.0–36.0)
MCV: 91.9 fL (ref 80.0–100.0)
Monocytes Absolute: 0.8 10*3/uL (ref 0.1–1.0)
Monocytes Relative: 7 %
Neutro Abs: 8.4 10*3/uL — ABNORMAL HIGH (ref 1.7–7.7)
Neutrophils Relative %: 69 %
Platelets: 299 10*3/uL (ref 150–400)
RBC: 4.07 MIL/uL — ABNORMAL LOW (ref 4.22–5.81)
RDW: 13.3 % (ref 11.5–15.5)
WBC: 12.1 10*3/uL — ABNORMAL HIGH (ref 4.0–10.5)
nRBC: 0 % (ref 0.0–0.2)

## 2020-04-21 LAB — CBG MONITORING, ED
Glucose-Capillary: 318 mg/dL — ABNORMAL HIGH (ref 70–99)
Glucose-Capillary: 268 mg/dL — ABNORMAL HIGH (ref 70–99)

## 2020-04-21 LAB — SEDIMENTATION RATE: Sed Rate: 92 mm/hr — ABNORMAL HIGH (ref 0–16)

## 2020-04-21 MED ORDER — CEFDINIR 300 MG PO CAPS: 300.0000 mg | ORAL_CAPSULE | Freq: Two times a day (BID) | ORAL | 0 refills | Status: AC

## 2020-04-21 MED ORDER — SODIUM CHLORIDE 0.9 % IV BOLUS
1000.0000 mL | Freq: Once | INTRAVENOUS | Status: AC
  Administered 2020-04-21: 1000 mL via INTRAVENOUS

## 2020-04-21 MED ORDER — CLINDAMYCIN HCL 150 MG PO CAPS
300.0000 mg | ORAL_CAPSULE | Freq: Once | ORAL | Status: AC
  Administered 2020-04-21: 14:00:00 300 mg via ORAL
  Filled 2020-04-21: qty 2

## 2020-04-21 MED ORDER — GLIMEPIRIDE 1 MG PO TABS
1.0000 mg | ORAL_TABLET | ORAL | 0 refills | Status: DC
Start: 2020-04-21 — End: 2022-01-27

## 2020-04-21 MED ORDER — DOXYCYCLINE HYCLATE 100 MG PO CAPS
100.0000 mg | ORAL_CAPSULE | Freq: Two times a day (BID) | ORAL | 0 refills | Status: DC
Start: 2020-04-21 — End: 2022-03-17

## 2020-04-21 MED ORDER — AMLODIPINE BESYLATE 5 MG PO TABS
5.0000 mg | ORAL_TABLET | Freq: Every day | ORAL | 0 refills | Status: DC
Start: 2020-04-21 — End: 2024-11-20

## 2020-04-21 MED ORDER — BACITRACIN-NEOMYCIN-POLYMYXIN 400-5-5000 EX OINT
TOPICAL_OINTMENT | Freq: Once | CUTANEOUS | Status: AC
  Filled 2020-04-21: qty 1

## 2020-04-21 NOTE — ED Triage Notes (Signed)
Pt with wound check to left great toe, seen here on 4/3 for same.  Pt states it looks better.

## 2020-04-21 NOTE — Progress Notes (Signed)
CSW has reviewed patients chart and notes that patient is without primary care and/or insurance. CSW will follow up with patient regarding this matter and provide assistance as needed.

## 2020-04-21 NOTE — ED Notes (Signed)
X-ray at bedside

## 2020-04-21 NOTE — Progress Notes (Signed)
CSW at bedside to inquire about patients primary care status. Patient confirms that he is without primary care. CSW educations patient about [facility] program and patient expresses interest and is receptive. CSW offers to assist patient in setting up appointment. CSW in contact with [facility] [place]: [phone]. Rep takes patients name and contact information and states that she would give him a call to set him up.   No further needs at this time  [place] Worker  Ph: [phone]

## 2020-04-21 NOTE — Consult Note (Addendum)
Medical Consultation   Jesse Nguyen  UJW:[id number]  DOB: December 07, 1971  DOA: 04/21/2020  PCP: Patient, No Pcp Per   Outpatient Specialists: None.  Requesting physician: Cherre Robins, PA.  Reason for consultation: Right leg ulcer.   History of Present Illness: Jesse Nguyen is an 49 y.o. male with history of uncontrolled diabetes mellitus, hypertension, hepatitis C, psoriasis.  Patient presented to the ED today with complaints of wound to his right great toe that he noticed about 2 days ago, also reports swelling to his right foot of 1 week duration.  Reports swelling occurs when he keeps his foot down, and improves when he raises it.  He denies pain or redness to his lower extremity, sensation is intact.  He denies fevers or chills, no vomiting.  Patient was in the ED 4/3 with similar complaints-wound to his left great toe.  He discharged from the ED with a course of clindamycin, which he took as prescribed and completed course about a week ago.  He reports he tolerated the medication well, no diarrhea.  The wound to his left foot has significantly improved and almost healed.  He reports the wound started due to rubbing of his toes against his footwear, without wearing socks.  No denies any other trauma to extremities.  Patient reports compliance with Metformin prescribed by the health department.  He occasionally checks his blood sugars.  He does not have a primary care provider which he follows with.  He works in Biomedical [employer].  ED course-stable vitals, temperature 98.7.  Heart rate 60s to 70s.  WBC 12.1.  Blood glucose 324.  ESR 92.  Left and right foot x-rays without significant abnormality.  Clindamycin given in the ED.  Hospitalist was called to evaluate for possible admission.  Review of Systems:  As per HPI otherwise review of systems negative.    Past Medical History: Past Medical History:  Diagnosis Date  . Diabetes mellitus without complication ([place])   .  Hepatitis C   . Hypertension   . Psoriasis     Past Surgical History: Past Surgical History:  Procedure Laterality Date  . EYE SURGERY       Allergies:   Allergies  Allergen Reactions  . Other Rash    Fragrance soaps,perfumes     Social History:  reports that he has been smoking cigarettes. He has been smoking about 0.50 packs per day. He has never used smokeless tobacco. He reports previous drug use. Frequency: 7.00 times per week. Drug: Marijuana. He reports that he does not drink alcohol.   Family History: History reviewed. No pertinent family history.   Physical Exam: Vitals:   04/21/20 1220 04/21/20 1221  BP: (!) 173/85   Pulse: 76   Resp: 12   Temp: 98.7 F (37.1 C)   TempSrc: Oral   SpO2: 99%   Weight:  93 kg  Height:  6' (1.829 m)    Constitutional:   Alert and awake, oriented x3, not in any acute distress. Eyes: PERLA, EOMI, irises appear normal, anicteric sclera,  ENMT: external ears and nose appear normal, oral mucosa moist Neck: neck appears normal, no masses, normal ROM, no thyromegaly, no JVD  CVS: S1-S2 clear, no murmur rubs or gallops, no LE edema, normal pedal pulses bilaterally, both foot well and well perfused. Respiratory:  clear to auscultation bilaterally, no wheezing, rales or rhonchi. Respiratory effort normal. No accessory muscle use.  Abdomen:  soft nontender, nondistended, normal bowel sounds, no hepatosplenomegaly, no hernias  Musculoskeletal: : No joint abnormality, normal range of motion. Neuro: Cranial nerves II-XII intact, strength, sensation, reflexes Psych: judgement and insight appear normal, stable mood and affect, mental status Skin: Wounds to right big toe involving the lateral aspect and second toe involving the medial aspect, with small amount of purulent drainage.  Mild swelling to right foot, no erythema, no tenderness.  Wound to left big toe has significantly improved compared to images on file from 4/3, wound is healing,  dry without purulence, no surrounding erythema or drainage.         Data reviewed:  I have personally reviewed following labs and imaging studies Labs:  CBC: Recent Labs  Lab 04/21/20 1357  WBC 12.1*  NEUTROABS 8.4*  HGB 12.5*  HCT 37.4*  MCV 91.9  PLT 932    Basic Metabolic Panel: Recent Labs  Lab 04/21/20 1357  NA 136  K 4.3  CL 103  CO2 24  GLUCOSE 324*  BUN 10  CREATININE 0.73  CALCIUM 9.1   CBG: Recent Labs  Lab 04/21/20 1315 04/21/20 1541  GLUCAP 318* 268*    Radiological Exams on Admission: DG Foot Complete Left  Result Date: 04/21/2020 CLINICAL DATA:  Soft tissue wounds.  Diabetes mellitus EXAM: LEFT FOOT - COMPLETE 3+ VIEW COMPARISON:  March 28, 2020 FINDINGS: Frontal, oblique, and lateral views obtained. There is soft tissue irregularity along the medial first digit, similar to prior study. No radiopaque foreign body in this area. There is no appreciable fracture or dislocation. No erosive change or bony destruction. Slight narrowing first MTP joint. Other joint spaces appear normal. There is a small posterior calcaneal spur. IMPRESSION: Soft tissue defect medial to the first proximal phalanx, also present previously. No radiopaque foreign body. No fracture or dislocation. No bony destruction. Slight narrowing first MTP joint. Electronically Signed   By: Lowella Grip III M.D.   On: 04/21/2020 13:48   DG Foot Complete Right  Result Date: 04/21/2020 CLINICAL DATA:  Soft tissue wounds.  Diabetes mellitus EXAM: RIGHT FOOT COMPLETE - 3+ VIEW COMPARISON:  None. FINDINGS: Frontal, oblique, and lateral views were obtained. There is mild soft tissue swelling dorsally. No fracture or dislocation. There is narrowing of the first MTP joint. Other joint spaces appear unremarkable. No erosive change or bony destruction. No radiopaque foreign body or soft tissue air. Small posterior calcaneal spur noted. Arterial vascular calcification noted between the proximal  first and second metatarsals. IMPRESSION: Soft tissue swelling dorsally. No fracture or dislocation. Narrowing first MTP joint. No bony destruction. No soft tissue air or radiopaque foreign body. Small posterior calcaneal spur. Atherosclerotic calcification noted. Electronically Signed   By: Lowella Grip III M.D.   On: 04/21/2020 13:46    Impression/Recommendations  Right diabetic foot wound-involving first and second toes, he is well-appearing, not septic, mild leukocytosis of 12.1.  Elevated ESR, x-rays right and lower left foot unremarkable.  Dorsalis pedis pulses present bilaterally.  Similar wound to left foot improved with oral antibiotics-clindamycin.  He is not vomiting.  Recurrent foot wounds likely due to uncontrolled diabetes.  He has no outpatient follow-up provider established.  He is at risk for recurrent foot infections, osteomyelitis and amputations if his diabetes is not controlled, I have emphasized and explained this to him.   He voiced understanding and agrees to follow-up with an outpatient provider.   - Social worker has been contacted to assist with setting up appointment. -Would not  repeat course of clindamycin considering risk of C. difficile, would recommend discharging patient on Omnicef and doxycycline, at least 10-day course, considering drainage from foot, to cover both gram positives and gram-negatives. -Follow-up with primary care provider, will likely need diabetic footwear, and back to follow-up with podiatrist. -Elevate legs. -Compliance with antibiotics has been emphasized. -At this time patient does not meet criteria for admission, I have recommended discharge from the ED with antibiotics and close follow-up. -Disposition from ED per ER provider  Uncontrolled diabetes-random glucose 324, 268 on discharge from the ED, home medications Metformin 1000 mg twice daily prescribed by the health department. -Emphasized he needs to follow-up with primary care provider  as outpatient.  He voiced understanding and is agreed to the plan. - Obtain HgbA1c - 12.4, will start Amaryl 65m daily. - Called patient about new medication amaryl, and encouraged to increase blood sugar checks.   Hypertension-blood pressure elevated systolic 1436Gto 1677C elevated on prior ED visit.  He is not on medication. -Consider discharge on Norvasc 5 mg daily to follow-up with outpatient provider.   Thank you for this consultation.     Time Spent:  20 Minutes.  EBethena RoysM.D. [facility] 04/21/2020, 6:48 PM

## 2020-04-21 NOTE — Discharge Instructions (Addendum)
You have been seen today for foot wounds. Please read and follow all provided instructions. Return to the emergency room for worsening condition or new concerning symptoms.    1. Medications:  Prescriptions sent to your pharmacy for 2 antibiotics.  The antibiotics are Omnicef and doxycycline.  It is important that you take these as prescribed. -Prescription also sent to pharmacy for a blood pressure medicine Norvasc. Please take this daily. I have sent a month prescription, you will need to have blood pressure  checked by primary care doctor in 1 week  Continue usual home medications Take medications as prescribed. Please review all of the medicines and only take them if you do not have an allergy to them.   2. Treatment: rest, drink plenty of fluids  3. Follow Up: Please follow-up with podiatry Dr. Caprice Beaver.  Call his office tomorrow to schedule the next available appointment.   Please also follow up with primary care provider by scheduling an appointment as soon as possible for a visit  If you do not have a primary care physician, contact [facility] at [phone] for referral   It is also a possibility that you have an allergic reaction to any of the medicines that you have been prescribed - Everybody reacts differently to medications and while MOST people have no trouble with most medicines, you may have a reaction such as nausea, vomiting, rash, swelling, shortness of breath. If this is the case, please stop taking the medicine immediately and contact your physician.  ?

## 2020-04-21 NOTE — ED Provider Notes (Signed)
[facility] EMERGENCY DEPARTMENT Provider Note   CSN: [id number] Arrival date & time: 04/21/20  1148     History Chief Complaint  Patient presents with  . Wound Check    Jesse Nguyen is a 49 y.o. male with past medical history significant for type 2 diabetes, hepatitis C, hypertension, psoriasis presents to emergency room today with chief complaint of wound check of left great toe. He is also reporting a new  wound to right great toe that he noticed x 1 week ago. Patient works as a Herbalist. He noticed his right foot was swollen and he has aching pain in his right great toe.  He rates the pain 6 out of 10 in severity. Patient was seen in the ED on 03/28/2020 for wound on left great toe.  He was prescribed clindamycin. He reports finishing prescription x 1 week ago.  He endorses subjective fever and chills.  He is prescribed his Metformin from the health department, he does not see a PCP regularly.  He denies any numbness or weakness in bilateral lower extremities.  Also denies any rash and myalgias.  History provided by patient with additional history obtained from chart review.     Past Medical History:  Diagnosis Date  . Diabetes mellitus without complication ([place])   . Hepatitis C   . Hypertension   . Psoriasis     There are no problems to display for this patient.   Past Surgical History:  Procedure Laterality Date  . EYE SURGERY         History reviewed. No pertinent family history.  Social History   Tobacco Use  . Smoking status: Current Every Day Smoker    Packs/day: 0.50    Types: Cigarettes  . Smokeless tobacco: Never Used  Substance Use Topics  . Alcohol use: No  . Drug use: Not Currently    Frequency: 7.0 times per week    Types: Marijuana    Home Medications Prior to Admission medications   Medication Sig Start Date End Date Taking? Authorizing Provider  metFORMIN (GLUCOPHAGE) 1000 MG tablet Take 1,000 mg by mouth 2 (two) times daily  with a meal.   Yes [provider]  cefdinir (OMNICEF) 300 MG capsule Take 1 capsule (300 mg total) by mouth 2 (two) times daily for 10 days. 04/21/20 05/01/20  Mohammed Mcandrew E, PA-C  doxycycline (VIBRAMYCIN) 100 MG capsule Take 1 capsule (100 mg total) by mouth 2 (two) times daily. 04/21/20   Matan Steen, Harley Hallmark, PA-C    Allergies    Other  Review of Systems   Review of Systems .01  Physical Exam Updated Vital Signs BP (!) 173/85 (BP Location: Right Arm)   Pulse 76   Temp 98.7 F (37.1 C) (Oral)   Resp 12   Ht 6' (1.829 m)   Wt 93 kg   SpO2 99%   BMI 27.80 kg/m   Physical Exam Vitals and nursing note reviewed.  Constitutional:      General: He is not in acute distress.    Appearance: He is not ill-appearing.  HENT:     Head: Normocephalic and atraumatic.     Right Ear: Tympanic membrane and external ear normal.     Left Ear: Tympanic membrane and external ear normal.     Nose: Nose normal.     Mouth/Throat:     Mouth: Mucous membranes are moist.     Pharynx: Oropharynx is clear.  Eyes:     General:  No scleral icterus.       Right eye: No discharge.        Left eye: No discharge.     Extraocular Movements: Extraocular movements intact.     Conjunctiva/sclera: Conjunctivae normal.     Pupils: Pupils are equal, round, and reactive to light.  Neck:     Vascular: No JVD.  Cardiovascular:     Rate and Rhythm: Normal rate and regular rhythm.     Pulses: Normal pulses.          Radial pulses are 2+ on the right side and 2+ on the left side.     Heart sounds: Normal heart sounds.  Pulmonary:     Comments: Lungs clear to auscultation in all fields. Symmetric chest rise. No wheezing, rales, or rhonchi. Abdominal:     Comments: Abdomen is soft, non-distended, and non-tender in all quadrants. No rigidity, no guarding. No peritoneal signs.  Musculoskeletal:        General: Normal range of motion.     Cervical back: Normal range of motion.     Comments: Full  range of motion of bilateral knees and ankles.  Patient walks with mildly antalgic gait.  Feet:     Comments: Please see media below.  Wounds to bilateral great toes.  There is active purulent drainage from wound on right great toe.  Right foot is warm to the touch. Edema noted to right foot extending to right ankle. Skin:    General: Skin is warm and dry.     Capillary Refill: Capillary refill takes less than 2 seconds.  Neurological:     Mental Status: He is oriented to person, place, and time.     GCS: GCS eye subscore is 4. GCS verbal subscore is 5. GCS motor subscore is 6.     Comments: Fluent speech, no facial droop.  Psychiatric:        Behavior: Behavior normal.               ED Results / Procedures / Treatments   Labs (all labs ordered are listed, but only abnormal results are displayed) Labs Reviewed  CBC WITH DIFFERENTIAL/PLATELET - Abnormal; Notable for the following components:      Result Value   WBC 12.1 (*)    RBC 4.07 (*)    Hemoglobin 12.5 (*)    HCT 37.4 (*)    Neutro Abs 8.4 (*)    All other components within normal limits  BASIC METABOLIC PANEL - Abnormal; Notable for the following components:   Glucose, Bld 324 (*)    All other components within normal limits  SEDIMENTATION RATE - Abnormal; Notable for the following components:   Sed Rate 92 (*)    All other components within normal limits  CBG MONITORING, ED - Abnormal; Notable for the following components:   Glucose-Capillary 318 (*)    All other components within normal limits  CBG MONITORING, ED - Abnormal; Notable for the following components:   Glucose-Capillary 268 (*)    All other components within normal limits  AEROBIC CULTURE (SUPERFICIAL SPECIMEN)    EKG None  Radiology DG Foot Complete Left  Result Date: 04/21/2020 CLINICAL DATA:  Soft tissue wounds.  Diabetes mellitus EXAM: LEFT FOOT - COMPLETE 3+ VIEW COMPARISON:  March 28, 2020 FINDINGS: Frontal, oblique, and lateral  views obtained. There is soft tissue irregularity along the medial first digit, similar to prior study. No radiopaque foreign body in this area. There is no appreciable  fracture or dislocation. No erosive change or bony destruction. Slight narrowing first MTP joint. Other joint spaces appear normal. There is a small posterior calcaneal spur. IMPRESSION: Soft tissue defect medial to the first proximal phalanx, also present previously. No radiopaque foreign body. No fracture or dislocation. No bony destruction. Slight narrowing first MTP joint. Electronically Signed   By: Lowella Grip III M.D.   On: 04/21/2020 13:48   DG Foot Complete Right  Result Date: 04/21/2020 CLINICAL DATA:  Soft tissue wounds.  Diabetes mellitus EXAM: RIGHT FOOT COMPLETE - 3+ VIEW COMPARISON:  None. FINDINGS: Frontal, oblique, and lateral views were obtained. There is mild soft tissue swelling dorsally. No fracture or dislocation. There is narrowing of the first MTP joint. Other joint spaces appear unremarkable. No erosive change or bony destruction. No radiopaque foreign body or soft tissue air. Small posterior calcaneal spur noted. Arterial vascular calcification noted between the proximal first and second metatarsals. IMPRESSION: Soft tissue swelling dorsally. No fracture or dislocation. Narrowing first MTP joint. No bony destruction. No soft tissue air or radiopaque foreign body. Small posterior calcaneal spur. Atherosclerotic calcification noted. Electronically Signed   By: Lowella Grip III M.D.   On: 04/21/2020 13:46    Procedures Procedures (including critical care time)  Medications Ordered in ED Medications  neomycin-bacitracin-polymyxin (NEOSPORIN) ointment packet (has no administration in time range)  clindamycin (CLEOCIN) capsule 300 mg (300 mg Oral Given 04/21/20 1354)  sodium chloride 0.9 % bolus 1,000 mL (0 mLs Intravenous Stopped 04/21/20 1539)    ED Course  I have reviewed the triage vital signs and  the nursing notes.  Pertinent labs & imaging results that were available during my care of the patient were reviewed by me and considered in my medical decision making (see chart for details).    MDM Rules/Calculators/A&P                      Patient seen and examined. Patient presents awake, alert, hemodynamically stable, afebrile, non toxic.  Patient is an uncontrolled diabetic presenting with bilateral wounds to great toes.  On exam he has erythema, edema and purulent drainage from wound on right great toe.  Wound on left toe does appear to be healing but still has an open area of pink granulomatous tissue.  CBG on arrival was 318.  Basic labs checked.  He has leukocytosis of 12.1.  He is hyperglycemic with glucose of 324, normal anion gap, does not appear to be in DKA.  Sed rate is elevated at 92.  Patient given IV fluids and p.o. clindamycin.  X-rays of right and left feet viewed by me do not show signs of osteomyelitis.  No fracture or dislocation seen.  Given his elevated sed rate and worsening infection despite recently finishing p.o. clindamycin will consult hospitalist. This case was discussed with Dr. Reather Converse who has seen the patient and agrees with plan of care. Spoke with Dr. Arlyce Dice with hospitalist service who evaluated patient.  Please see her consult note.  As patient does not appear to be septic and has reassuring vital signs, will discharge with p.o. antibiotics Omnicef and doxycycline to give broader coverage while avoiding second round of clindamycin. Wound culture performed of right great toe. Wound care performed and wounds dressed.  The patient appears reasonably screened and/or stabilized for discharge and I doubt any other medical condition or other [facility] requiring further screening, evaluation, or treatment in the ED at this time prior to discharge. The  patient is safe for discharge with very strict return precautions discussed. Recommend close dietary follow up and patient given  information for local podiatrist.   Portions of this note were generated with Dragon dictation software. Dictation errors may occur despite best attempts at proofreading.    Final Clinical Impression(s) / ED Diagnoses Final diagnoses:  Diabetic foot infection ([place])    Rx / DC Orders ED Discharge Orders         Ordered    cefdinir (OMNICEF) 300 MG capsule  2 times daily     04/21/20 1624    doxycycline (VIBRAMYCIN) 100 MG capsule  2 times daily     04/21/20 1624           Sircharles Holzheimer, Erie Noe 04/21/20 1631    Elnora Morrison, MD 04/22/20 [phone]

## 2020-04-25 LAB — AEROBIC CULTURE W GRAM STAIN (SUPERFICIAL SPECIMEN)

## 2020-04-26 ENCOUNTER — Telehealth: Payer: Self-pay | Admitting: Emergency Medicine

## 2020-04-26 NOTE — Telephone Encounter (Signed)
Post ED Visit - Positive Culture Follow-up  Culture report reviewed by antimicrobial stewardship pharmacist: [facility] []  [address], Pharm.D. []  Heide Guile, Pharm.D., BCPS AQ-ID []  Parks Neptune, Pharm.D., BCPS []  Alycia Rossetti, Pharm.D., BCPS []  Lorenzo, Pharm.D., BCPS, AAHIVP []  Legrand Como, Pharm.D., BCPS, AAHIVP []  Salome Arnt, PharmD, BCPS []  Johnnette Gourd, PharmD, BCPS []  Hughes Better, PharmD, BCPS []  Leeroy Cha, PharmD []  Laqueta [facility], PharmD, BCPS [x]  Albertina Parr, PharmD  [facility] []  Leodis Sias, PharmD []  Lindell Spar, PharmD []  Royetta Asal, PharmD []  Graylin Shiver, Rph []  Rema Fendt) Glennon Mac, PharmD []  Arlyn Dunning, PharmD []  Netta Cedars, PharmD []  Dia Sitter, PharmD []  Leone Haven, PharmD []  Gretta Arab, PharmD []  Theodis Shove, PharmD []  Peggyann Juba, PharmD []  Reuel Boom, PharmD   Positive aerobic culture Treated with Cefdinir and Doxycycline, organism sensitive to the same and no further patient follow-up is required at this time.  Sandi Raveling Nelson Noone 04/26/2020, 11:24 AM

## 2020-09-12 IMAGING — DX DG FOOT COMPLETE 3+V*L*
3 series · 3 of 3 positions shown · non-contrast
Comparison: March 28, 2020

CLINICAL DATA: Soft tissue wounds.  Diabetes mellitus

EXAM:
LEFT FOOT - COMPLETE 3+ VIEW

[foot ap]
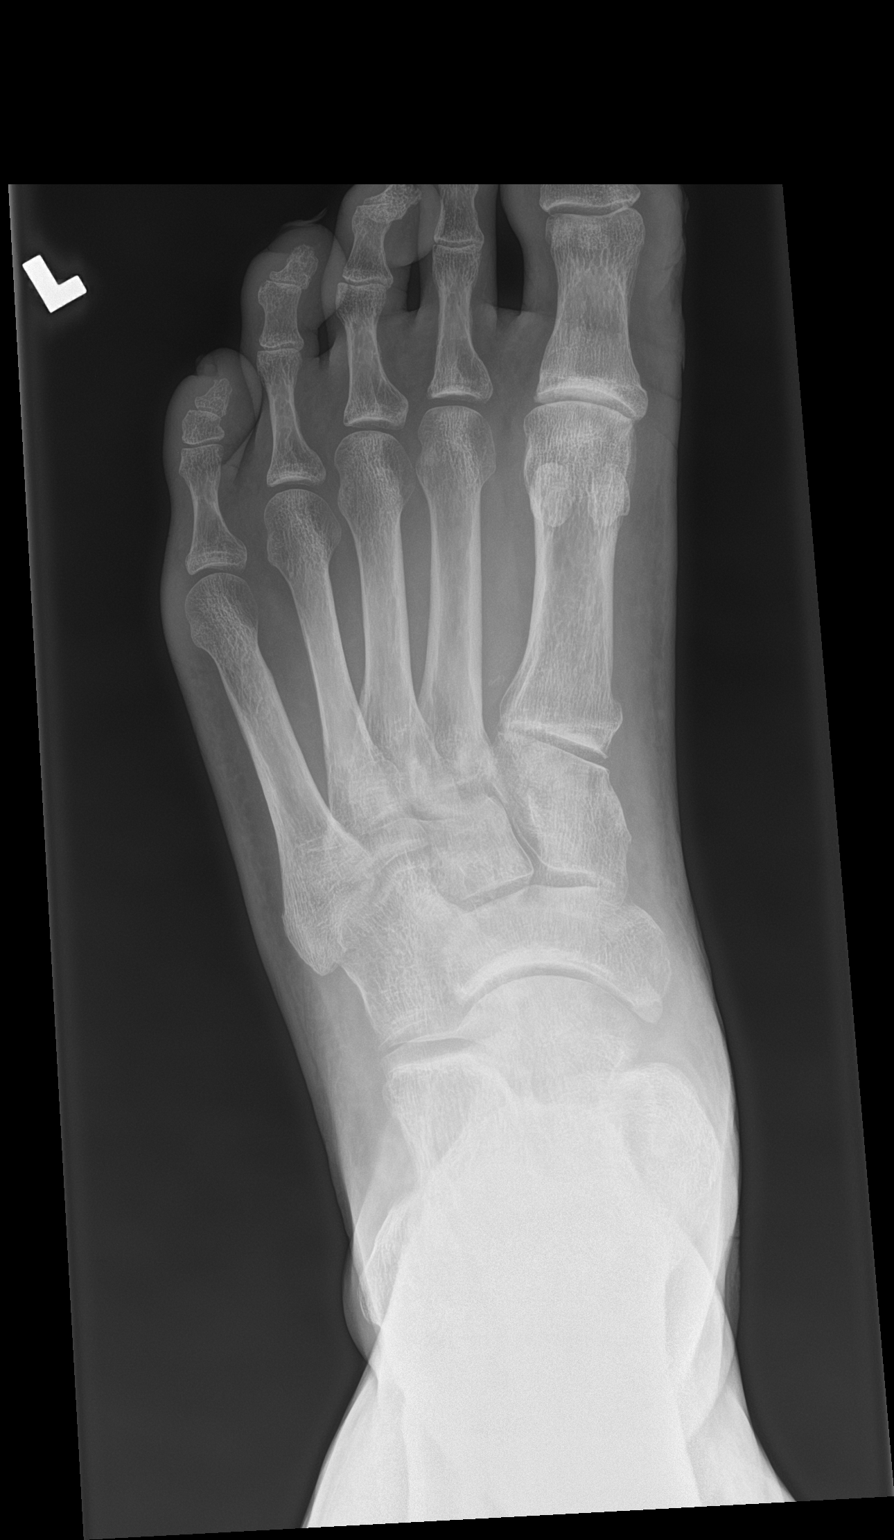

[foot obl]
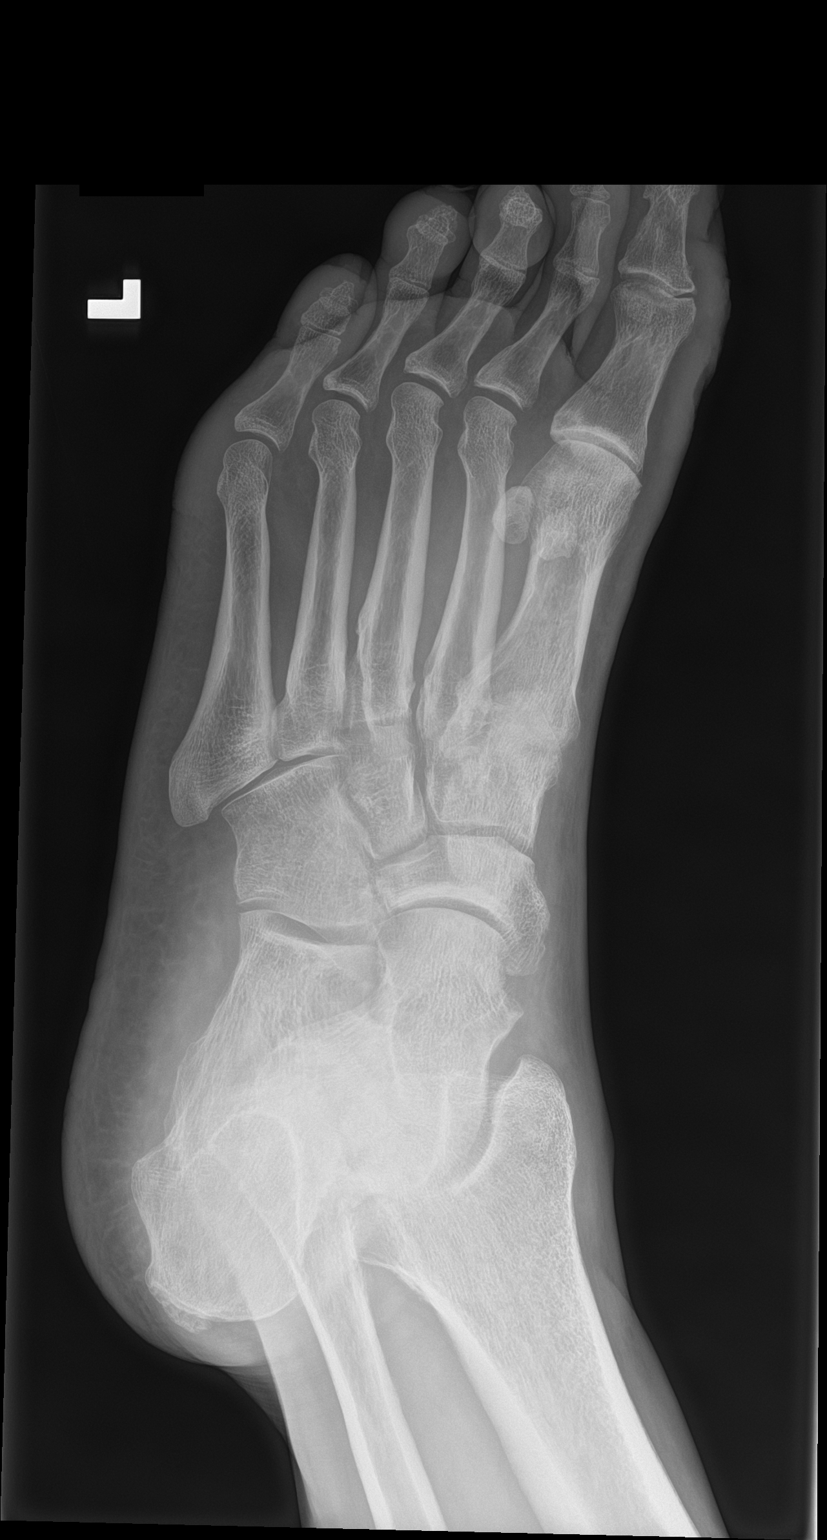

[foot lat]
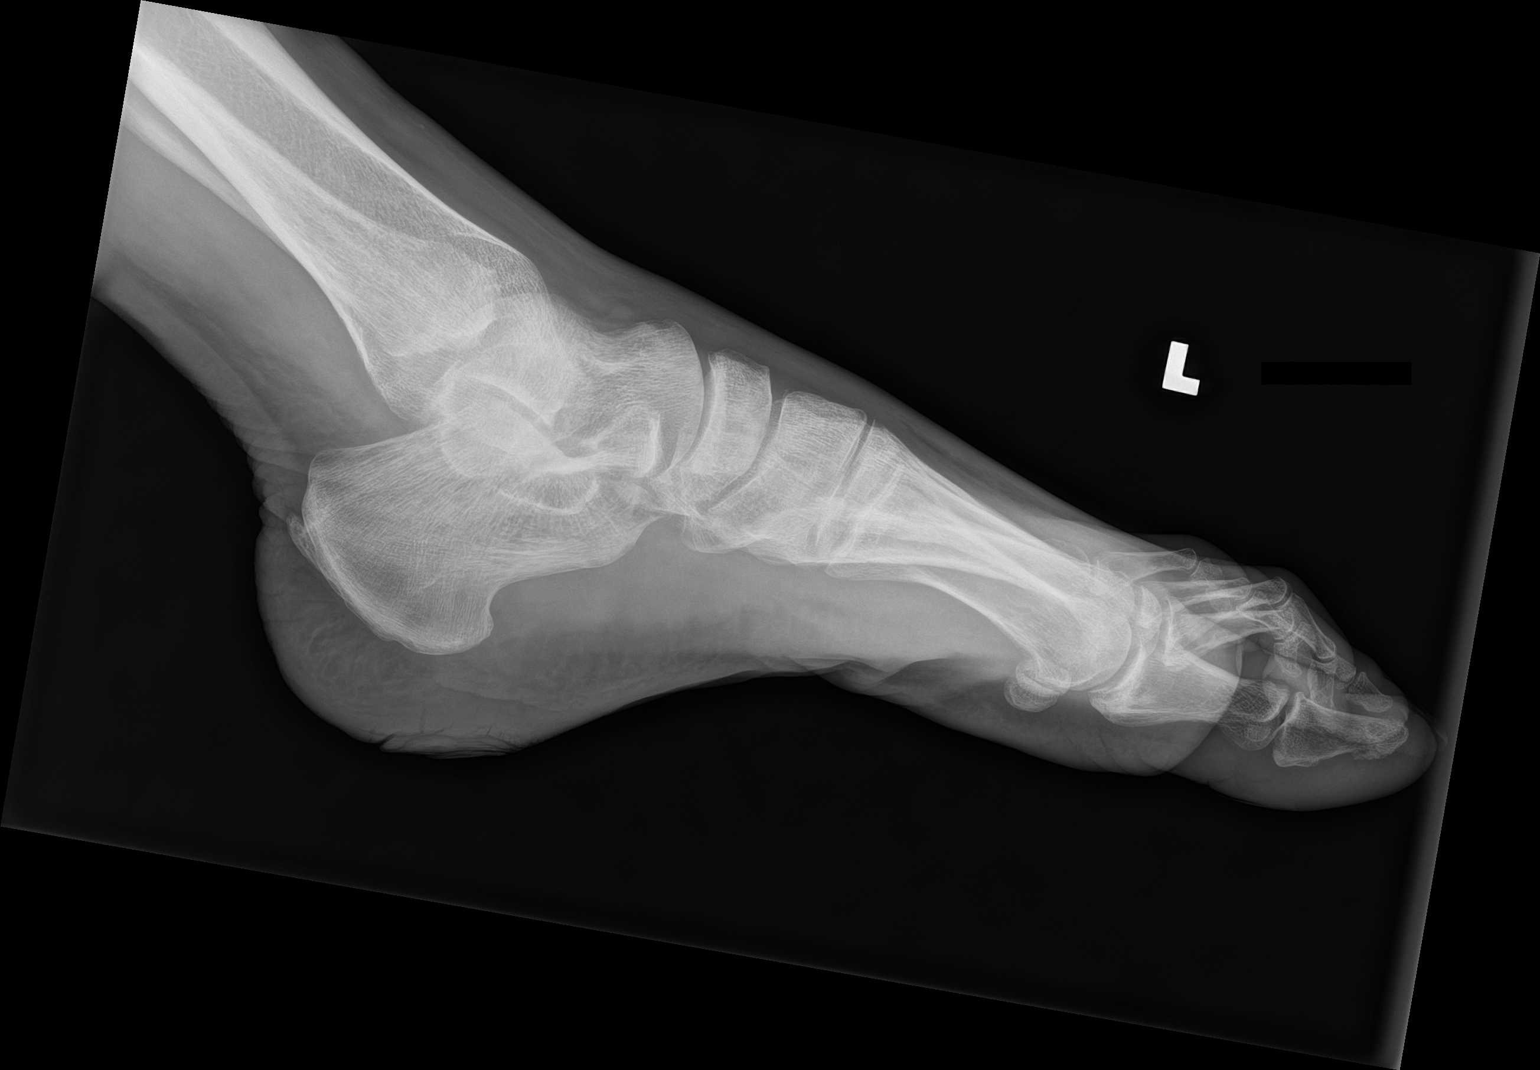

[3 of 3 positions shown; findings below may reference images not displayed]

FINDINGS: Frontal, oblique, and lateral views obtained. There is soft tissue
irregularity along the medial first digit, similar to prior study.
No radiopaque foreign body in this area. There is no appreciable
fracture or dislocation. No erosive change or bony destruction.
Slight narrowing first MTP joint. Other joint spaces appear normal.
There is a small posterior calcaneal spur.
IMPRESSION: Soft tissue defect medial to the first proximal phalanx, also
present previously. No radiopaque foreign body. No fracture or
dislocation. No bony destruction. Slight narrowing first MTP joint.

## 2021-04-01 ENCOUNTER — Other Ambulatory Visit: Payer: Self-pay

## 2021-04-01 ENCOUNTER — Other Ambulatory Visit (HOSPITAL_COMMUNITY): Payer: Self-pay | Admitting: *Deleted

## 2021-04-01 ENCOUNTER — Ambulatory Visit (HOSPITAL_COMMUNITY)
Admission: RE | Admit: 2021-04-01 | Discharge: 2021-04-01 | Disposition: A | Discharge status: Home / Self Care | Payer: Self-pay | Source: Ambulatory Visit | Attending: *Deleted | Admitting: *Deleted

## 2021-04-01 DIAGNOSIS — R600 Localized edema: Secondary | ICD-10-CM | POA: Insufficient documentation

## 2021-08-23 IMAGING — US US EXTREM LOW VENOUS*R*
1 series · 13 of 24 positions shown · non-contrast
Comparison: None.

CLINICAL DATA: 49-year-old male with right lower extremity edema
for 3 weeks.

EXAM:
RIGHT LOWER EXTREMITY VENOUS DOPPLER ULTRASOUND
TECHNIQUE: Gray-scale sonography with graded compression, as well as color
Doppler and duplex ultrasound were performed to evaluate the right
lower extremity deep venous systems from the level of the common
femoral vein and including the common femoral, femoral, profunda
femoral, popliteal and calf veins including the posterior tibial,
peroneal and gastrocnemius veins when visible. Spectral Doppler was
utilized to evaluate flow at rest and with distal augmentation
maneuvers in the common femoral, femoral and popliteal veins. The
contralateral common femoral vein was also evaluated for comparison.

[Series 1: us extrem low venous*right* · 0.08mm/px · 13 of 35 slices shown]
[im 1/35]
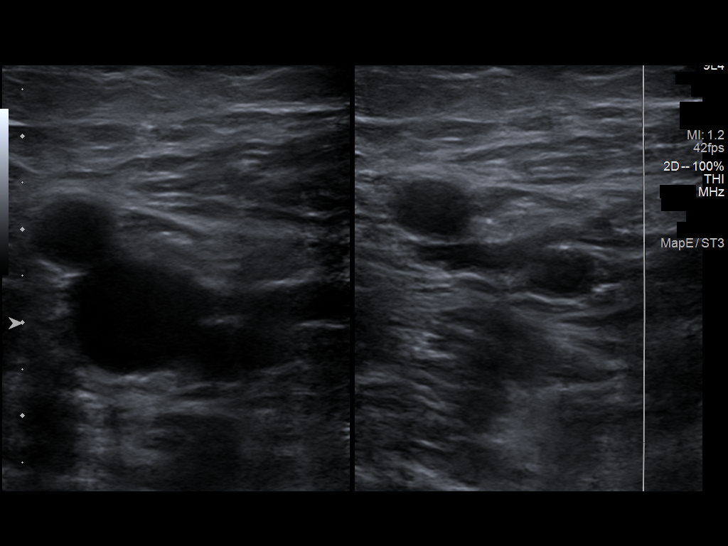
[im 3/35]
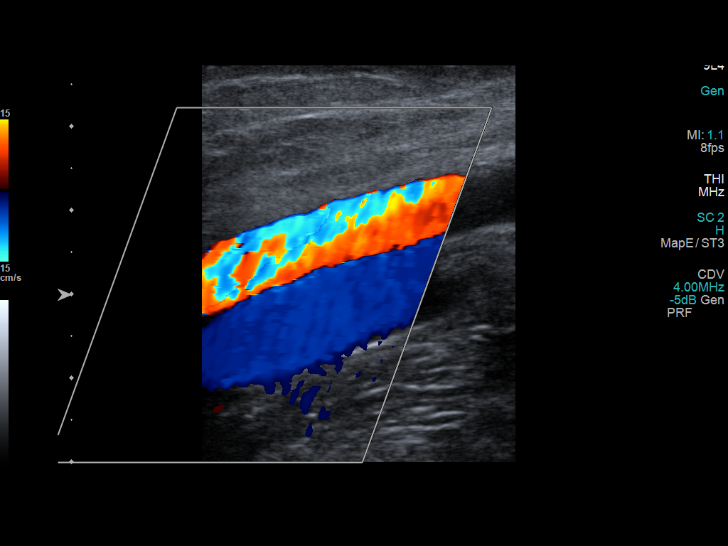
[im 6/35]
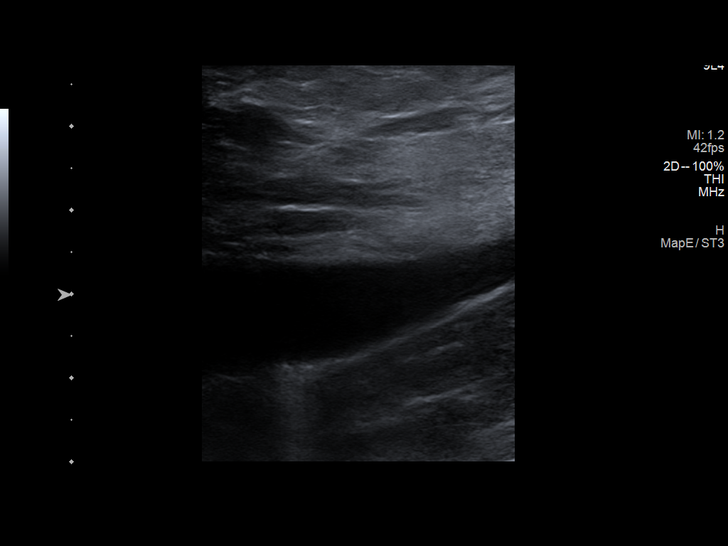
[im 9/35]
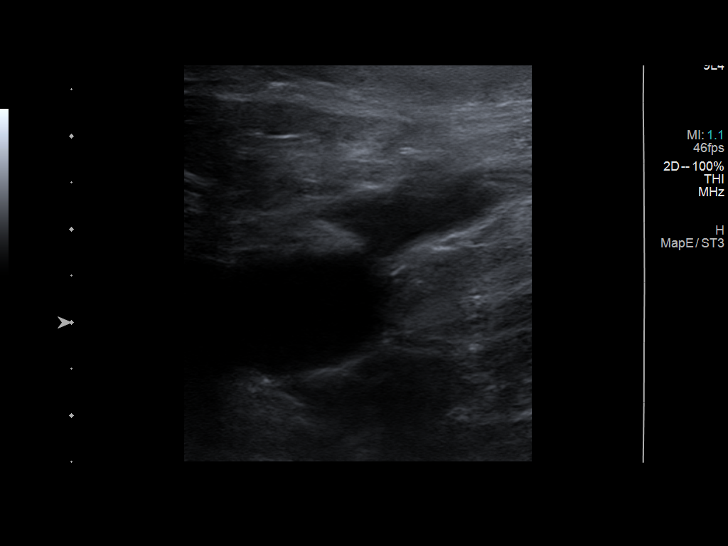
[im 12/35]
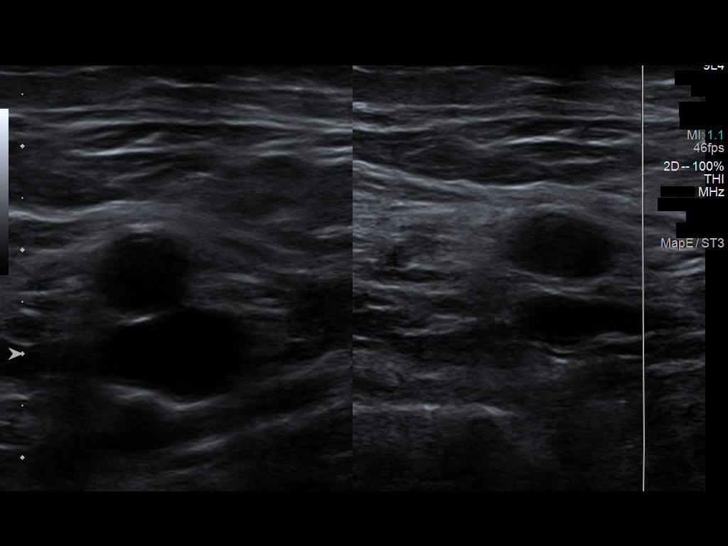
[im 15/35]
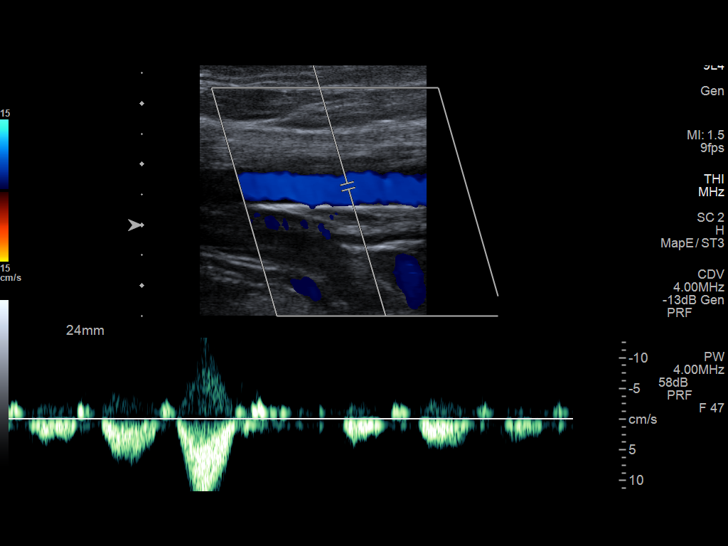
[im 18/35]
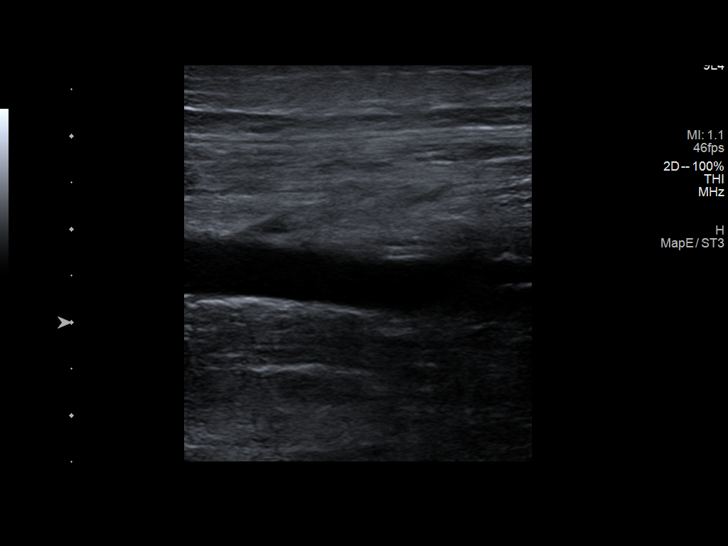
[im 20/35]
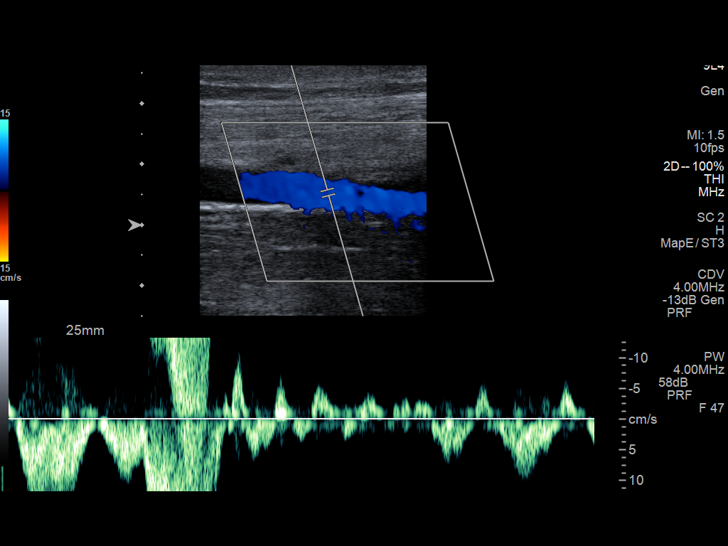
[im 23/35]
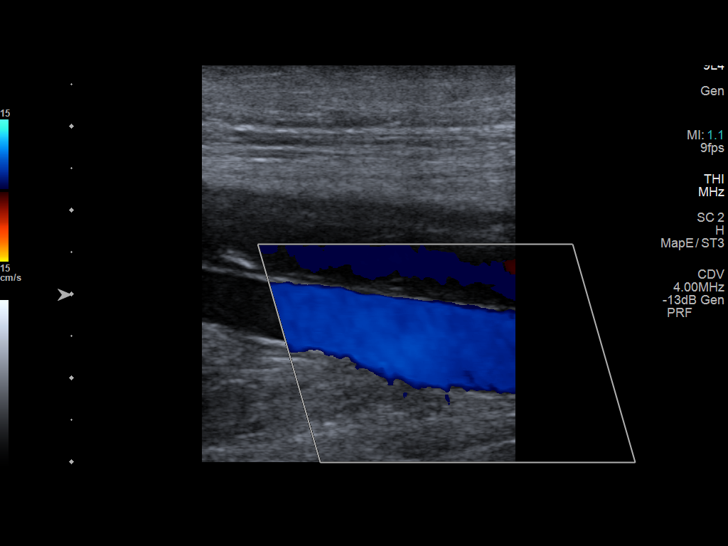
[im 26/35]
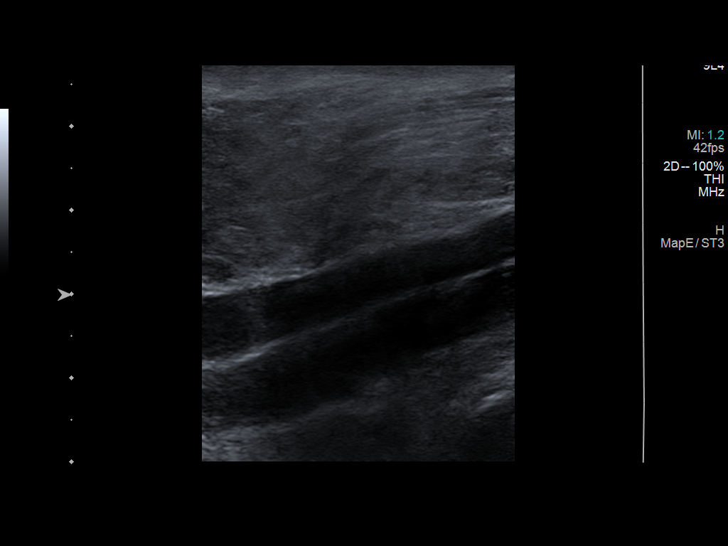
[im 29/35]
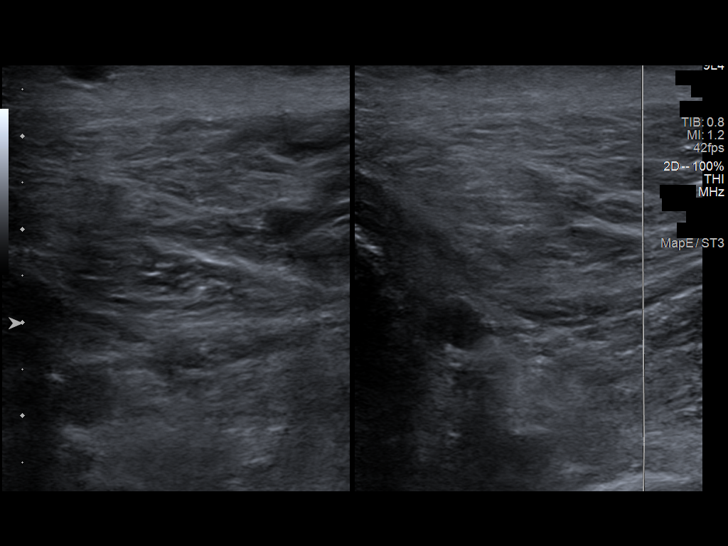
[im 32/35]
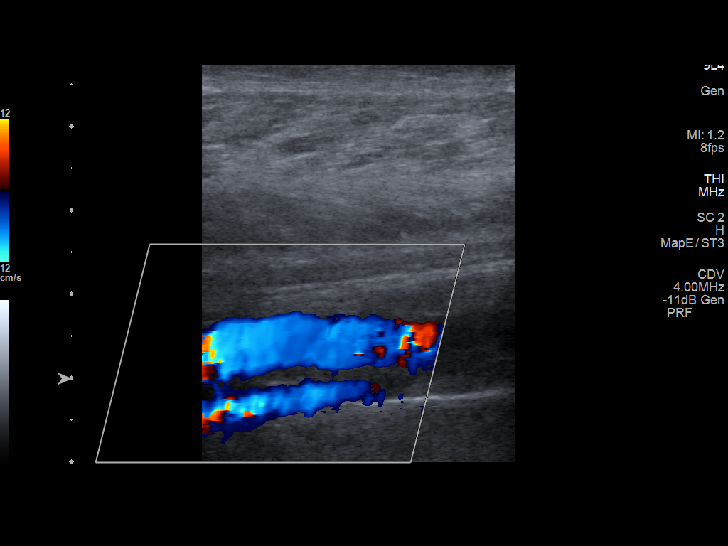
[im 35/35]
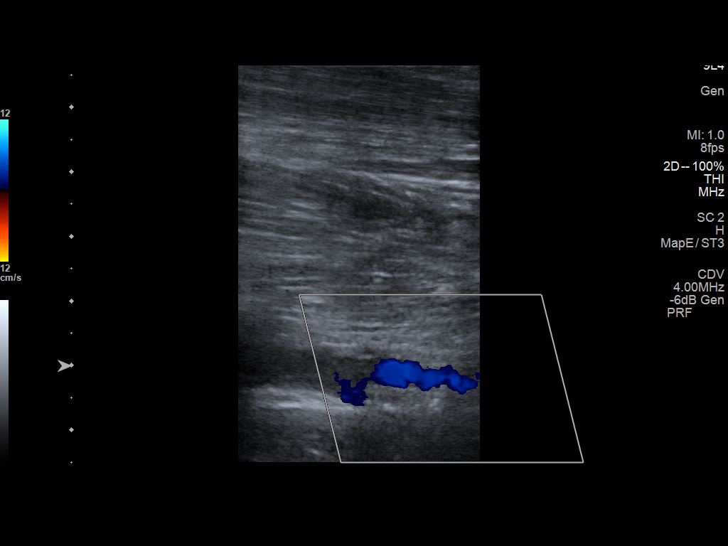

[13 of 24 positions shown; findings below may reference images not displayed]

FINDINGS: RIGHT LOWER EXTREMITY

Common Femoral Vein: No evidence of thrombus. Normal
compressibility, respiratory phasicity and response to augmentation.

Central Greater Saphenous Vein: No evidence of thrombus. Normal
compressibility and flow on color Doppler imaging.

Central Profunda Femoral Vein: No evidence of thrombus. Normal
compressibility and flow on color Doppler imaging.

Femoral Vein: No evidence of thrombus. Normal compressibility,
respiratory phasicity and response to augmentation.

Popliteal Vein: No evidence of thrombus. Normal compressibility,
respiratory phasicity and response to augmentation.

Calf Veins: No evidence of thrombus. Normal compressibility and flow
on color Doppler imaging.

Other Findings:  None.

LEFT LOWER EXTREMITY

Common Femoral Vein: No evidence of thrombus. Normal
compressibility, respiratory phasicity and response to augmentation.
IMPRESSION: No evidence of right lower extremity deep venous thrombosis.

## 2021-09-22 ENCOUNTER — Telehealth: Payer: Self-pay

## 2021-09-22 NOTE — Telephone Encounter (Signed)
Recently enrolled client of [facility]. Attempted call for follow up. No answer left voicemail requesting return call.   [employer]

## 2021-12-24 ENCOUNTER — Ambulatory Visit: Payer: Self-pay | Admitting: Urology

## 2021-12-24 NOTE — Progress Notes (Deleted)
° °  Assessment: 1. Microscopic hematuria      Plan: ***  Chief Complaint: No chief complaint on file.   History of Present Illness:  Jesse Nguyen is a 50 y.o. year old male who is seen in consultation from Royce Macadamia D., PA-C for evaluation of microscopic hematuria.  U/A from 6/22:  2+ blood U/A from 9/22:  2+ blood   Past Medical History:  Past Medical History:  Diagnosis Date   Diabetes mellitus without complication ([place])    Hepatitis C    Hypertension    Psoriasis     Past Surgical History:  Past Surgical History:  Procedure Laterality Date   EYE SURGERY      Allergies:  Allergies  Allergen Reactions   Other Rash    Fragrance soaps,perfumes    Family History:  No family history on file.  Social History:  Social History   Tobacco Use   Smoking status: Every Day    Packs/day: 0.50    Types: Cigarettes   Smokeless tobacco: Never  Substance Use Topics   Alcohol use: No   Drug use: Not Currently    Frequency: 7.0 times per week    Types: Marijuana    Review of symptoms:  Constitutional:  Negative for unexplained weight loss, night sweats, fever, chills ENT:  Negative for nose bleeds, sinus pain, painful swallowing CV:  Negative for chest pain, shortness of breath, exercise intolerance, palpitations, loss of consciousness Resp:  Negative for cough, wheezing, shortness of breath GI:  Negative for nausea, vomiting, diarrhea, bloody stools GU:  Positives noted in HPI; otherwise negative for gross hematuria, dysuria, urinary incontinence Neuro:  Negative for seizures, poor balance, limb weakness, slurred speech Psych:  Negative for lack of energy, depression, anxiety Endocrine:  Negative for polydipsia, polyuria, symptoms of hypoglycemia (dizziness, hunger, sweating) Hematologic:  Negative for anemia, purpura, petechia, prolonged or excessive bleeding, use of anticoagulants  Allergic:  Negative for difficulty breathing or choking as a result of  exposure to anything; no shellfish allergy; no allergic response (rash/itch) to materials, foods  Physical exam: There were no vitals taken for this visit. GENERAL APPEARANCE:  Well appearing, well developed, well nourished, NAD HEENT: Atraumatic, Normocephalic, oropharynx clear. NECK: Supple without lymphadenopathy or thyromegaly. LUNGS: Clear to auscultation bilaterally. HEART: Regular Rate and Rhythm without murmurs, gallops, or rubs. ABDOMEN: Soft, non-tender, No Masses. EXTREMITIES: Moves all extremities well.  Without clubbing, cyanosis, or edema. NEUROLOGIC:  Alert and oriented x 3, normal gait, CN II-XII grossly intact.  MENTAL STATUS:  Appropriate. BACK:  Non-tender to palpation.  No CVAT SKIN:  Warm, dry and intact.    Results: No results found for this or any previous visit (from the past 24 hour(s)).

## 2022-01-03 HISTORY — PX: INTRAOPERATIVE ARTERIOGRAM: SHX5157

## 2022-01-04 HISTORY — PX: TRANSMETATARSAL AMPUTATION: SHX6197

## 2022-01-06 ENCOUNTER — Ambulatory Visit: Payer: Medicaid Other | Admitting: Urology

## 2022-01-11 ENCOUNTER — Other Ambulatory Visit (HOSPITAL_COMMUNITY): Payer: Self-pay | Admitting: Nephrology

## 2022-01-11 DIAGNOSIS — R809 Proteinuria, unspecified: Secondary | ICD-10-CM

## 2022-01-11 DIAGNOSIS — D638 Anemia in other chronic diseases classified elsewhere: Secondary | ICD-10-CM

## 2022-01-11 DIAGNOSIS — E1122 Type 2 diabetes mellitus with diabetic chronic kidney disease: Secondary | ICD-10-CM

## 2022-01-19 ENCOUNTER — Ambulatory Visit: Payer: Medicaid Other | Admitting: Urology

## 2022-01-19 NOTE — Progress Notes (Deleted)
° °  Assessment: 1. Microscopic hematuria     Plan: ***  Chief Complaint: No chief complaint on file.   History of Present Illness:  Jesse Nguyen is a 51 y.o. year old male who is seen in consultation from Royce Macadamia D., PA-C for evaluation of microscopic hematuria.  U/A from 6/22:  2+ blood U/A from 9/22:  2+ blood U/A from 1/23:  8 RBC, TNTC bacteria.  Urine cx:  no growth  Renal U/S from 12/31/21 showed no evidence of hydronephrosis or renal mass. He has an elevated creatinine at 2.24.   Past Medical History:  Past Medical History:  Diagnosis Date   Diabetes mellitus without complication ([place])    Hepatitis C    Hypertension    Psoriasis     Past Surgical History:  Past Surgical History:  Procedure Laterality Date   EYE SURGERY      Allergies:  Allergies  Allergen Reactions   Other Rash    Fragrance soaps,perfumes    Family History:  No family history on file.  Social History:  Social History   Tobacco Use   Smoking status: Every Day    Packs/day: 0.50    Types: Cigarettes   Smokeless tobacco: Never  Substance Use Topics   Alcohol use: No   Drug use: Not Currently    Frequency: 7.0 times per week    Types: Marijuana    Review of symptoms:  Constitutional:  Negative for unexplained weight loss, night sweats, fever, chills ENT:  Negative for nose bleeds, sinus pain, painful swallowing CV:  Negative for chest pain, shortness of breath, exercise intolerance, palpitations, loss of consciousness Resp:  Negative for cough, wheezing, shortness of breath GI:  Negative for nausea, vomiting, diarrhea, bloody stools GU:  Positives noted in HPI; otherwise negative for gross hematuria, dysuria, urinary incontinence Neuro:  Negative for seizures, poor balance, limb weakness, slurred speech Psych:  Negative for lack of energy, depression, anxiety Endocrine:  Negative for polydipsia, polyuria, symptoms of hypoglycemia (dizziness, hunger,  sweating) Hematologic:  Negative for anemia, purpura, petechia, prolonged or excessive bleeding, use of anticoagulants  Allergic:  Negative for difficulty breathing or choking as a result of exposure to anything; no shellfish allergy; no allergic response (rash/itch) to materials, foods  Physical exam: There were no vitals taken for this visit. GENERAL APPEARANCE:  Well appearing, well developed, well nourished, NAD HEENT: Atraumatic, Normocephalic, oropharynx clear. NECK: Supple without lymphadenopathy or thyromegaly. LUNGS: Clear to auscultation bilaterally. HEART: Regular Rate and Rhythm without murmurs, gallops, or rubs. ABDOMEN: Soft, non-tender, No Masses. EXTREMITIES: Moves all extremities well.  Without clubbing, cyanosis, or edema. NEUROLOGIC:  Alert and oriented x 3, normal gait, CN II-XII grossly intact.  MENTAL STATUS:  Appropriate. BACK:  Non-tender to palpation.  No CVAT SKIN:  Warm, dry and intact.    Results: No results found for this or any previous visit (from the past 24 hour(s)).

## 2022-01-26 ENCOUNTER — Ambulatory Visit (HOSPITAL_COMMUNITY): Admission: RE | Admit: 2022-01-26 | Payer: Medicaid Other | Source: Ambulatory Visit

## 2022-01-28 ENCOUNTER — Encounter (HOSPITAL_COMMUNITY): Payer: Self-pay

## 2022-01-28 ENCOUNTER — Other Ambulatory Visit (HOSPITAL_COMMUNITY): Payer: Medicaid Other

## 2022-02-02 ENCOUNTER — Ambulatory Visit: Payer: Medicaid Other | Admitting: Urology

## 2022-02-02 NOTE — Progress Notes (Deleted)
° °  Assessment: 1. Microscopic hematuria      Plan: ***  Chief Complaint: No chief complaint on file.   History of Present Illness:  Jesse Nguyen is a 51 y.o. year old male who is seen in consultation from Royce Macadamia D., PA-C for evaluation of microscopic hematuria.  U/A from 6/22:  2+ blood U/A from 9/22:  2+ blood U/A from 1/23:  8 RBC, TNTC bacteria.  Urine cx:  no growth  Renal U/S from 12/31/21 showed no evidence of hydronephrosis or renal mass. He has an elevated creatinine at 2.24.   Past Medical History:  Past Medical History:  Diagnosis Date   Diabetes mellitus without complication ([place])    Hepatitis C    Hypertension    Psoriasis     Past Surgical History:  Past Surgical History:  Procedure Laterality Date   EYE SURGERY      Allergies:  Allergies  Allergen Reactions   Other Rash    Fragrance soaps,perfumes    Family History:  No family history on file.  Social History:  Social History   Tobacco Use   Smoking status: Every Day    Packs/day: 0.50    Types: Cigarettes   Smokeless tobacco: Never  Substance Use Topics   Alcohol use: No   Drug use: Not Currently    Frequency: 7.0 times per week    Types: Marijuana    Review of symptoms:  Constitutional:  Negative for unexplained weight loss, night sweats, fever, chills ENT:  Negative for nose bleeds, sinus pain, painful swallowing CV:  Negative for chest pain, shortness of breath, exercise intolerance, palpitations, loss of consciousness Resp:  Negative for cough, wheezing, shortness of breath GI:  Negative for nausea, vomiting, diarrhea, bloody stools GU:  Positives noted in HPI; otherwise negative for gross hematuria, dysuria, urinary incontinence Neuro:  Negative for seizures, poor balance, limb weakness, slurred speech Psych:  Negative for lack of energy, depression, anxiety Endocrine:  Negative for polydipsia, polyuria, symptoms of hypoglycemia (dizziness, hunger,  sweating) Hematologic:  Negative for anemia, purpura, petechia, prolonged or excessive bleeding, use of anticoagulants  Allergic:  Negative for difficulty breathing or choking as a result of exposure to anything; no shellfish allergy; no allergic response (rash/itch) to materials, foods  Physical exam: There were no vitals taken for this visit. GENERAL APPEARANCE:  Well appearing, well developed, well nourished, NAD HEENT: Atraumatic, Normocephalic, oropharynx clear. NECK: Supple without lymphadenopathy or thyromegaly. LUNGS: Clear to auscultation bilaterally. HEART: Regular Rate and Rhythm without murmurs, gallops, or rubs. ABDOMEN: Soft, non-tender, No Masses. EXTREMITIES: Moves all extremities well.  Without clubbing, cyanosis, or edema. NEUROLOGIC:  Alert and oriented x 3, normal gait, CN II-XII grossly intact.  MENTAL STATUS:  Appropriate. BACK:  Non-tender to palpation.  No CVAT SKIN:  Warm, dry and intact.    Results: No results found for this or any previous visit (from the past 24 hour(s)).

## 2022-02-03 ENCOUNTER — Ambulatory Visit (HOSPITAL_COMMUNITY): Admission: RE | Admit: 2022-02-03 | Payer: Medicaid Other | Source: Ambulatory Visit

## 2022-02-15 ENCOUNTER — Other Ambulatory Visit: Payer: Self-pay | Admitting: Radiology

## 2022-02-15 ENCOUNTER — Other Ambulatory Visit: Payer: Self-pay | Admitting: Student

## 2022-02-16 ENCOUNTER — Ambulatory Visit (HOSPITAL_COMMUNITY): Admission: RE | Admit: 2022-02-16 | Payer: Self-pay | Source: Ambulatory Visit

## 2022-02-22 ENCOUNTER — Other Ambulatory Visit: Payer: Self-pay | Admitting: Radiology

## 2022-02-22 NOTE — H&P (Incomplete)
Chief Complaint: Patient was seen in consultation today for non-focal renal biopsy   Referring Physician(s): Bhutani,Manpreet S  Supervising Physician: Ruthann Cancer  Patient Status: [facility] - Out-pt  History of Present Illness: Jesse Nguyen is a 51 y.o. 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 He was referred to nephrology for worsening kidney function and nephrotic range proteinuria. Lab work up has been unrevealing. Interventional Radiology has been asked to evaluate this patient for an image-guided non-focal renal biopsy for further work up.   Past Medical History:  Diagnosis Date   Diabetes mellitus without complication ([place])    Hepatitis C    Hypertension    Psoriasis     Past Surgical History:  Procedure Laterality Date   EYE SURGERY      Allergies: Other  Medications: Prior to Admission medications   Medication Sig Start Date End Date Taking? Authorizing Provider  amLODipine (NORVASC) 5 MG tablet Take 1 tablet (5 mg total) by mouth daily. 04/21/20 02/15/22  Sherol Dade E, PA-C  amoxicillin-clavulanate (AUGMENTIN) 875-125 MG tablet Take 1 tablet by mouth 2 (two) times daily.    [provider]  Ascorbic Acid (VITAMIN C PO) Take 1 tablet by mouth daily.    [provider]  Aspirin-Acetaminophen-Caffeine (GOODY HEADACHE PO) Take 1-2 packets by mouth daily as needed (pain).    [provider]  bismuth subsalicylate (PEPTO BISMOL) 262 MG/15ML suspension Take 30 mLs by mouth every 6 (six) hours as needed for diarrhea or loose stools or indigestion.    [provider]  clopidogrel (PLAVIX) 75 MG tablet Take 75 mg by mouth daily.    [provider]  doxycycline (VIBRAMYCIN) 100 MG capsule Take 1 capsule (100 mg total) by mouth 2 (two) times daily. Patient taking differently: Take 100 mg by mouth  daily. 04/21/20   Walisiewicz, Verline Lema E, PA-C  hydrALAZINE (APRESOLINE) 50 MG tablet Take 50 mg by mouth every 8 (eight) hours. 08/19/21   [provider]  lisinopril (ZESTRIL) 40 MG tablet Take 40 mg by mouth daily.    [provider]  Multiple Vitamins-Minerals (ZINC PO) Take 1 tablet by mouth daily.    [provider]  omeprazole (PRILOSEC) 20 MG capsule Take 20 mg by mouth daily.    [provider]  rosuvastatin (CRESTOR) 5 MG tablet Take 10 mg by mouth at bedtime.    [provider]  sitaGLIPtin (JANUVIA) 100 MG tablet Take 50 mg by mouth daily.    [provider]  Sodium Hypochlorite (HYSEPT) 0.25 % SOLN Apply 1 application topically daily.    [provider]  Torsemide 40 MG TABS Take 80 mg by mouth 2 (two) times daily.    [provider]     No family history on file.  Social History   Socioeconomic History   Marital status: Single    Spouse name: Not on file   Number of children: Not on file   Years of education: Not on file   Highest education level: Not on file  Occupational History   Not on file  Tobacco Use   Smoking status: Every Day    Packs/day: 0.50    Types: Cigarettes   Smokeless tobacco: Never  Substance and Sexual Activity   Alcohol use: No   Drug use: Not Currently    Frequency: 7.0 times per week    Types: Marijuana   Sexual  activity: Yes  Other Topics Concern   Not on file  Social History Narrative   Not on file   Social Determinants of Health   Financial Resource Strain: Not on file  Food Insecurity: Not on file  Transportation Needs: Not on file  Physical Activity: Not on file  Stress: Not on file  Social Connections: Not on file    Review of Systems: A 12 point ROS discussed and pertinent positives are indicated in the HPI above.  All other systems are negative.  Review of Systems  Vital Signs: There were no vitals taken for this visit.  Physical  Exam  Imaging: No results found.  Labs:  CBC: No results for input(s): WBC, HGB, HCT, PLT in the last 8760 hours.  COAGS: No results for input(s): INR, APTT in the last 8760 hours.  BMP: No results for input(s): NA, K, CL, CO2, GLUCOSE, BUN, CALCIUM, CREATININE, GFRNONAA, GFRAA in the last 8760 hours.  Invalid input(s): CMP  LIVER FUNCTION TESTS: No results for input(s): BILITOT, AST, ALT, ALKPHOS, PROT, ALBUMIN in the last 8760 hours.  TUMOR MARKERS: No results for input(s): AFPTM, CEA, CA199, CHROMGRNA in the last 8760 hours.  Assessment and Plan:  Chronic kidney disease; proteinuria: Jesse Nguyen, 51 year old male, presents today to the [facility] department for an image-guided non-focal renal biopsy.  Risks and benefits of this procedure were discussed with the patient and/or patient's family including, but not limited to bleeding, infection, damage to adjacent structures or low yield requiring additional tests.  All of the questions were answered and there is agreement to proceed. He has been NPO. Last dose of Plavix was ***  Consent signed and in chart.  Thank you for this interesting consult.  I greatly enjoyed meeting Jesse Nguyen and look forward to participating in their care.  A copy of this report was sent to the requesting provider on this date.  Electronically Signed: Soyla Dryer, AGACNP-BC [phone] 02/22/2022, 3:21 PM   I spent a total of {New YVOP:[id number]} {New Out-Pt:[id number]}  {Established Out-Pt:[id number]} in face to face in clinical consultation, greater than 50% of which was counseling/coordinating care for ***

## 2022-02-23 ENCOUNTER — Ambulatory Visit (HOSPITAL_COMMUNITY): Admission: RE | Admit: 2022-02-23 | Payer: Self-pay | Source: Ambulatory Visit

## 2022-03-02 HISTORY — PX: INCISION AND DRAINAGE BURSA / SUB-FASCIA OF FOOT: SUR673

## 2022-03-17 ENCOUNTER — Other Ambulatory Visit: Payer: Self-pay | Admitting: Radiology

## 2022-03-20 NOTE — H&P (Incomplete)
? ?Chief Complaint: ?Patient was seen in consultation today for random kidney biopsy with moderate sedation at the request of [place] S ? ?Referring Physician(s): ?Bhutani,Manpreet S ? ?Supervising Physician: Ruthann Cancer ? ?Patient Status: [facility] - Out-pt ? ?History of Present Illness: ?Jesse Nguyen is a 51 y.o. male w/ PMH of DM type II, HCV, HTN, CKD and psoriasis.  Patient was referred by Dr. Ulice Bold for random kidney biopsy due to proteinuria and anemia of chronic disease. ? ?Past Medical History:  ?Diagnosis Date  ? Diabetes mellitus without complication ([place])   ? Hepatitis C   ? Hypertension   ? Psoriasis   ? ? ?Past Surgical History:  ?Procedure Laterality Date  ? EYE SURGERY    ? ? ?Allergies: ?Other ? ?Medications: ?Prior to Admission medications   ?Medication Sig Start Date End Date Taking? Authorizing Provider  ?amoxicillin-clavulanate (AUGMENTIN XR) 1000-62.5 MG 12 hr tablet Take 2 tablets by mouth 2 (two) times daily.   Yes [provider]  ?ascorbic acid (VITAMIN C) 500 MG tablet Take 1,000 mg by mouth 2 (two) times daily.   Yes [provider]  ?Aspirin-Acetaminophen-Caffeine (GOODY HEADACHE PO) Take 1-2 packets by mouth daily as needed (pain).   Yes [provider]  ?bismuth subsalicylate (PEPTO BISMOL) 262 MG/15ML suspension Take 30 mLs by mouth every 6 (six) hours as needed for diarrhea or loose stools or indigestion.   Yes [provider]  ?hydrALAZINE (APRESOLINE) 50 MG tablet Take 50 mg by mouth daily. 08/19/21  Yes [provider]  ?lisinopril (ZESTRIL) 20 MG tablet Take 20 mg by mouth 2 (two) times daily.   Yes [provider]  ?sitaGLIPtin (JANUVIA) 100 MG tablet Take 50 mg by mouth 2 (two) times daily.   Yes [provider]  ?Sodium Hypochlorite (HYSEPT) 0.25 % SOLN Apply 1 application. topically 2 (two) times daily.   Yes [provider]  ?Torsemide 40 MG TABS Take 80 mg by mouth 2 (two) times  daily.   Yes [provider]  ?amLODipine (NORVASC) 5 MG tablet Take 1 tablet (5 mg total) by mouth daily. ?Patient not taking: Reported on 03/17/2022 04/21/20 03/17/22  Barrie Folk, PA-C  ?  ? ?No family history on file. ? ?Social History  ? ?Socioeconomic History  ? Marital status: Single  ?  Spouse name: Not on file  ? Number of children: Not on file  ? Years of education: Not on file  ? Highest education level: Not on file  ?Occupational History  ? Not on file  ?Tobacco Use  ? Smoking status: Every Day  ?  Packs/day: 0.50  ?  Types: Cigarettes  ? Smokeless tobacco: Never  ?Substance and Sexual Activity  ? Alcohol use: No  ? Drug use: Not Currently  ?  Frequency: 7.0 times per week  ?  Types: Marijuana  ? Sexual activity: Yes  ?Other Topics Concern  ? Not on file  ?Social History Narrative  ? Not on file  ? ?Social Determinants of Health  ? ?Financial Resource Strain: Not on file  ?Food Insecurity: Not on file  ?Transportation Needs: Not on file  ?Physical Activity: Not on file  ?Stress: Not on file  ?Social Connections: Not on file  ? ? ? ?Review of Systems: A 12 point ROS discussed and pertinent positives are indicated in the HPI above.  All other systems are negative. ? ?Review of Systems ? ?Vital Signs: ?There were no vitals taken for this visit. ? ?Physical  Exam ? ?Imaging: ?No results found. ? ?Labs: ? ?CBC: ?No results for input(s): WBC, HGB, HCT, PLT in the last 8760 hours. ? ?COAGS: ?No results for input(s): INR, APTT in the last 8760 hours. ? ?BMP: ?No results for input(s): NA, K, CL, CO2, GLUCOSE, BUN, CALCIUM, CREATININE, GFRNONAA, GFRAA in the last 8760 hours. ? ?Invalid input(s): CMP ? ?LIVER FUNCTION TESTS: ?No results for input(s): BILITOT, AST, ALT, ALKPHOS, PROT, ALBUMIN in the last 8760 hours. ? ?TUMOR MARKERS: ?No results for input(s): AFPTM, CEA, CA199, CHROMGRNA in the last 8760 hours. ? ?Assessment and Plan: ?History of DM type II, HCV, HTN, CKD and psoriasis.  Patient was  referred by Dr. Ulice Bold for random kidney biopsy due to proteinuria and anemia of chronic disease. ? ?Risks and benefits of random kidney biopsy was discussed with the patient and/or patient's family including, but not limited to bleeding, infection, damage to adjacent structures or low yield requiring additional tests. ? ?All of the questions were answered and there is agreement to proceed. ? ?Consent signed and in chart.  ? ?Thank you for this interesting consult.  I greatly enjoyed meeting Treylon Phagan and look forward to participating in their care.  A copy of this report was sent to the requesting provider on this date. ? ?Electronically Signed: ?Tyson Alias, NP ?03/20/2022, 3:32 PM ? ? ?I spent a total of 20 minutes in face to face in clinical consultation, greater than 50% of which was counseling/coordinating care for random kidney biopsy with moderate sedation. ?

## 2022-03-21 ENCOUNTER — Ambulatory Visit (HOSPITAL_COMMUNITY)
Admission: RE | Admit: 2022-03-21 | Discharge: 2022-03-21 | Disposition: A | Discharge status: Home / Self Care | Payer: Self-pay | Source: Ambulatory Visit | Attending: Nephrology | Admitting: Nephrology

## 2022-10-26 ENCOUNTER — Encounter: Payer: Medicaid Other | Admitting: Vascular Surgery

## 2022-11-09 ENCOUNTER — Ambulatory Visit: Payer: Medicaid Other | Admitting: Vascular Surgery

## 2022-11-09 ENCOUNTER — Encounter: Payer: Self-pay | Admitting: Vascular Surgery

## 2022-11-09 VITALS — BP 144/79 | HR 77 | Temp 97.2°F | Ht 72.0 in | Wt 174.8 lb

## 2022-11-09 DIAGNOSIS — Z992 Dependence on renal dialysis: Secondary | ICD-10-CM | POA: Diagnosis not present

## 2022-11-09 DIAGNOSIS — N186 End stage renal disease: Secondary | ICD-10-CM

## 2022-11-09 NOTE — Progress Notes (Signed)
[facility] [place]  Patient name: Jesse Nguyen MRN: [id number] DOB: [birth year] Sex: male  REASON FOR CONSULT: Discuss access for hemodialysis  HPI: Carlo Guevarra is a 51 y.o. male, who is here today for discussion of access for hemodialysis.  He has had progressive renal insufficiency and now has renal failure.  He was admitted to [facility] approximately 2 months ago and had a hemodialysis catheter placed for immediate hemodialysis at that time.  He has a history of diabetes and hypertension as a cause of his renal failure.  He reports that he does have an achy sensation with his catheter but has had no difficulty with catheter flow.  He is right-handed.  He does not have a pacemaker.  He is on Coumadin therapy for recent diagnosis during his [facility] admission for left leg DVT  Past Medical History:  Diagnosis Date   CKD (chronic kidney disease), stage III ([place])    Diabetes mellitus without complication ([place])    Hepatitis C    HLD (hyperlipidemia)    Hypertension    Psoriasis     Family History  Problem Relation Age of Onset   Diabetes Father    Diabetes Sister     SOCIAL HISTORY: Social History   Socioeconomic History   Marital status: Single    Spouse name: Not on file   Number of children: Not on file   Years of education: Not on file   Highest education level: Not on file  Occupational History   Not on file  Tobacco Use   Smoking status: Every Day    Packs/day: 0.25    Types: Cigarettes   Smokeless tobacco: Never  Vaping Use   Vaping Use: Never used  Substance and Sexual Activity   Alcohol use: No   Drug use: Not Currently    Frequency: 7.0 times per week    Types: Marijuana   Sexual activity: Yes  Other Topics Concern   Not on file  Social History Narrative   Not on file   Social Determinants of Health   Financial Resource Strain: Not on file  Food Insecurity: Not on file   Transportation Needs: Not on file  Physical Activity: Not on file  Stress: Not on file  Social Connections: Not on file  Intimate Partner Violence: Not on file    Allergies  Allergen Reactions   Other Rash    Fragrance soaps,perfumes    Current Outpatient Medications  Medication Sig Dispense Refill   amLODipine (NORVASC) 5 MG tablet Take 1 tablet (5 mg total) by mouth daily. 30 tablet 0   ascorbic acid (VITAMIN C) 500 MG tablet Take 1,000 mg by mouth 2 (two) times daily.     Aspirin-Acetaminophen-Caffeine (GOODY HEADACHE PO) Take 1-2 packets by mouth daily as needed (pain).     bismuth subsalicylate (PEPTO BISMOL) 262 MG/15ML suspension Take 30 mLs by mouth every 6 (six) hours as needed for diarrhea or loose stools or indigestion.     carvedilol (COREG) 25 MG tablet Take 25 mg by mouth.     cyanocobalamin (VITAMIN B12) 1000 MCG tablet Take 1 tablet by mouth daily.     gabapentin (NEURONTIN) 100 MG capsule Take by mouth.     hydrALAZINE (APRESOLINE) 50 MG tablet Take 50 mg by mouth daily.     lisinopril (ZESTRIL) 20 MG tablet Take 20 mg by mouth 2 (two) times daily.     sitaGLIPtin (JANUVIA) 100 MG tablet Take 50 mg  by mouth 2 (two) times daily.     Sodium Hypochlorite (HYSEPT) 0.25 % SOLN Apply 1 application. topically 2 (two) times daily.     Torsemide 40 MG TABS Take 80 mg by mouth 2 (two) times daily.     warfarin (COUMADIN) 2.5 MG tablet      warfarin (COUMADIN) 5 MG tablet Take half tablet (2.5 mg) on 10/1 and 10/2. Upcoming doses will be determined by warfarin clinic. If unable to schedule clinic appointment on 10/3, take 5 mg that day and call your PCP for further instructions.     amoxicillin-clavulanate (AUGMENTIN XR) 1000-62.5 MG 12 hr tablet Take 2 tablets by mouth 2 (two) times daily. (Patient not taking: Reported on 11/09/2022)     No current facility-administered medications for this visit.    REVIEW OF SYSTEMS:  [X]  denotes positive finding, [ ]  denotes negative  finding Cardiac  Comments:  Chest pain or chest pressure:    Shortness of breath upon exertion:    Short of breath when lying flat:    Irregular heart rhythm:        Vascular    Pain in calf, thigh, or hip brought on by ambulation:    Pain in feet at night that wakes you up from your sleep:     Blood clot in your veins:    Leg swelling:         Pulmonary    Oxygen at home:    Productive cough:     Wheezing:         Neurologic    Sudden weakness in arms or legs:     Sudden numbness in arms or legs:     Sudden onset of difficulty speaking or slurred speech:    Temporary loss of vision in one eye:     Problems with dizziness:         Gastrointestinal    Blood in stool:     Vomited blood:         Genitourinary    Burning when urinating:     Blood in urine:        Psychiatric    Major depression:         Hematologic    Bleeding problems:    Problems with blood clotting too easily:        Skin    Rashes or ulcers:        Constitutional    Fever or chills:      PHYSICAL EXAM: Vitals:   11/09/22 1055  BP: (!) 144/79  Pulse: 77  Temp: (!) 97.2 F (36.2 C)  SpO2: 100%  Weight: 174 lb 12.8 oz (79.3 kg)  Height: 6' (1.829 m)    GENERAL: The patient is a well-nourished male, in no acute distress. The vital signs are documented above. CARDIOVASCULAR: 2 Plus radial pulses bilaterally.  Small surface veins bilaterally PULMONARY: There is good air exchange  MUSCULOSKELETAL: There are no major deformities or cyanosis. NEUROLOGIC: No focal weakness or paresthesias are detected. SKIN: There are no ulcers or rashes noted. PSYCHIATRIC: The patient has a normal affect.  DATA:  I imaged his cephalic and basilic veins with SonoSite ultrasound.  He does have moderate size antecubital vein bilaterally.  The cephalic vein vein becomes very small above this and he has small basilic veins bilaterally  MEDICAL ISSUES: I discussed options at length with the patient for  long-term hemodialysis.  He understands the risk of ongoing use of a catheter.  I have recommended a left arm access.  In all likelihood this will be AV Gore-Tex graft.  I explained that we would image and explore his cephalic vein at the time of surgery to determine if he does have possible fistula placement.  We will hold his Coumadin prior to surgery and plan surgery in the next 1 to 2 weeks   Rosetta Posner, MD [facility] [phone] Pager [phone]  Note: Portions of this report may have been transcribed using voice recognition software.  Every effort has been made to ensure accuracy; however, inadvertent computerized transcription errors may still be present.

## 2022-11-09 NOTE — H&P (View-Only) (Signed)
V[facility] and Vein Specialist of [place]  Patient name: Jesse Nguyen MRN: [id number] DOB: Apr 30, 1971 Sex: male  REASON FOR CONSULT: Discuss access for hemodialysis  HPI: Jesse Nguyen is a 51 y.o. male, who is here today for discussion of access for hemodialysis.  He has had progressive renal insufficiency and now has renal failure.  He was admitted to [facility] approximately 2 months ago and had a hemodialysis catheter placed for immediate hemodialysis at that time.  He has a history of diabetes and hypertension as a cause of his renal failure.  He reports that he does have an achy sensation with his catheter but has had no difficulty with catheter flow.  He is right-handed.  He does not have a pacemaker.  He is on Coumadin therapy for recent diagnosis during his [facility] admission for left leg DVT  Past Medical History:  Diagnosis Date   CKD (chronic kidney disease), stage III ([place])    Diabetes mellitus without complication ([place])    Hepatitis C    HLD (hyperlipidemia)    Hypertension    Psoriasis     Family History  Problem Relation Age of Onset   Diabetes Father    Diabetes Sister     SOCIAL HISTORY: Social History   Socioeconomic History   Marital status: Single    Spouse name: Not on file   Number of children: Not on file   Years of education: Not on file   Highest education level: Not on file  Occupational History   Not on file  Tobacco Use   Smoking status: Every Day    Packs/day: 0.25    Types: Cigarettes   Smokeless tobacco: Never  Vaping Use   Vaping Use: Never used  Substance and Sexual Activity   Alcohol use: No   Drug use: Not Currently    Frequency: 7.0 times per week    Types: Marijuana   Sexual activity: Yes  Other Topics Concern   Not on file  Social History Narrative   Not on file   Social Determinants of Health   Financial Resource Strain: Not on file  Food Insecurity: Not on file   Transportation Needs: Not on file  Physical Activity: Not on file  Stress: Not on file  Social Connections: Not on file  Intimate Partner Violence: Not on file    Allergies  Allergen Reactions   Other Rash    Fragrance soaps,perfumes    Current Outpatient Medications  Medication Sig Dispense Refill   amLODipine (NORVASC) 5 MG tablet Take 1 tablet (5 mg total) by mouth daily. 30 tablet 0   ascorbic acid (VITAMIN C) 500 MG tablet Take 1,000 mg by mouth 2 (two) times daily.     Aspirin-Acetaminophen-Caffeine (GOODY HEADACHE PO) Take 1-2 packets by mouth daily as needed (pain).     bismuth subsalicylate (PEPTO BISMOL) 262 MG/15ML suspension Take 30 mLs by mouth every 6 (six) hours as needed for diarrhea or loose stools or indigestion.     carvedilol (COREG) 25 MG tablet Take 25 mg by mouth.     cyanocobalamin (VITAMIN B12) 1000 MCG tablet Take 1 tablet by mouth daily.     gabapentin (NEURONTIN) 100 MG capsule Take by mouth.     hydrALAZINE (APRESOLINE) 50 MG tablet Take 50 mg by mouth daily.     lisinopril (ZESTRIL) 20 MG tablet Take 20 mg by mouth 2 (two) times daily.     sitaGLIPtin (JANUVIA) 100 MG tablet Take 50 mg  by mouth 2 (two) times daily.     Sodium Hypochlorite (HYSEPT) 0.25 % SOLN Apply 1 application. topically 2 (two) times daily.     Torsemide 40 MG TABS Take 80 mg by mouth 2 (two) times daily.     warfarin (COUMADIN) 2.5 MG tablet      warfarin (COUMADIN) 5 MG tablet Take half tablet (2.5 mg) on 10/1 and 10/2. Upcoming doses will be determined by warfarin clinic. If unable to schedule clinic appointment on 10/3, take 5 mg that day and call your PCP for further instructions.     amoxicillin-clavulanate (AUGMENTIN XR) 1000-62.5 MG 12 hr tablet Take 2 tablets by mouth 2 (two) times daily. (Patient not taking: Reported on 11/09/2022)     No current facility-administered medications for this visit.    REVIEW OF SYSTEMS:  [X]  denotes positive finding, [ ]  denotes negative  finding Cardiac  Comments:  Chest pain or chest pressure:    Shortness of breath upon exertion:    Short of breath when lying flat:    Irregular heart rhythm:        Vascular    Pain in calf, thigh, or hip brought on by ambulation:    Pain in feet at night that wakes you up from your sleep:     Blood clot in your veins:    Leg swelling:         Pulmonary    Oxygen at home:    Productive cough:     Wheezing:         Neurologic    Sudden weakness in arms or legs:     Sudden numbness in arms or legs:     Sudden onset of difficulty speaking or slurred speech:    Temporary loss of vision in one eye:     Problems with dizziness:         Gastrointestinal    Blood in stool:     Vomited blood:         Genitourinary    Burning when urinating:     Blood in urine:        Psychiatric    Major depression:         Hematologic    Bleeding problems:    Problems with blood clotting too easily:        Skin    Rashes or ulcers:        Constitutional    Fever or chills:      PHYSICAL EXAM: Vitals:   11/09/22 1055  BP: (!) 144/79  Pulse: 77  Temp: (!) 97.2 F (36.2 C)  SpO2: 100%  Weight: 174 lb 12.8 oz (79.3 kg)  Height: 6' (1.829 m)    GENERAL: The patient is a well-nourished male, in no acute distress. The vital signs are documented above. CARDIOVASCULAR: 2 Plus radial pulses bilaterally.  Small surface veins bilaterally PULMONARY: There is good air exchange  MUSCULOSKELETAL: There are no major deformities or cyanosis. NEUROLOGIC: No focal weakness or paresthesias are detected. SKIN: There are no ulcers or rashes noted. PSYCHIATRIC: The patient has a normal affect.  DATA:  I imaged his cephalic and basilic veins with SonoSite ultrasound.  He does have moderate size antecubital vein bilaterally.  The cephalic vein vein becomes very small above this and he has small basilic veins bilaterally  MEDICAL ISSUES: I discussed options at length with the patient for  long-term hemodialysis.  He understands the risk of ongoing use of a catheter.  I have recommended a left arm access.  In all likelihood this will be AV Gore-Tex graft.  I explained that we would image and explore his cephalic vein at the time of surgery to determine if he does have possible fistula placement.  We will hold his Coumadin prior to surgery and plan surgery in the next 1 to 2 weeks   Rosetta Posner, MD [facility] Vascular and Vein Specialists of [facility] [phone] Pager [phone]  Note: Portions of this report may have been transcribed using voice recognition software.  Every effort has been made to ensure accuracy; however, inadvertent computerized transcription errors may still be present.

## 2022-11-10 ENCOUNTER — Other Ambulatory Visit: Payer: Self-pay

## 2022-11-10 DIAGNOSIS — N186 End stage renal disease: Secondary | ICD-10-CM

## 2022-11-16 ENCOUNTER — Encounter (HOSPITAL_COMMUNITY)
Admission: RE | Admit: 2022-11-16 | Discharge: 2022-11-16 | Disposition: A | Discharge status: Home / Self Care | Payer: Medicaid Other | Source: Ambulatory Visit | Attending: Vascular Surgery | Admitting: Vascular Surgery

## 2022-11-16 VITALS — Ht 72.0 in | Wt 174.8 lb

## 2022-11-16 DIAGNOSIS — N186 End stage renal disease: Secondary | ICD-10-CM

## 2022-11-16 DIAGNOSIS — E119 Type 2 diabetes mellitus without complications: Secondary | ICD-10-CM

## 2022-11-21 ENCOUNTER — Encounter (HOSPITAL_COMMUNITY): Payer: Self-pay

## 2022-11-21 ENCOUNTER — Other Ambulatory Visit: Payer: Self-pay

## 2022-11-21 NOTE — Pre-Procedure Instructions (Signed)
Messaged [place] at Dr Luther Parody office because his phone states that number cannot be completed as dialed. We have been unable to contact patient for surgery on 11/22/2022.

## 2022-11-21 NOTE — Pre-Procedure Instructions (Signed)
Called Jesse Nguyen in [place] and spoke with Jesse Nguyen who let me talk to Jesse Nguyen, whose phone is not working. Did preop interview over phone and he voiced understanding of all inatructions. "One of my sisters will drive me home and be with me that day". He will bring cell number with him tomorrow. Patient states that,"they took me off my diabetic and BP meds because everything has been to low".

## 2022-11-22 ENCOUNTER — Encounter (HOSPITAL_COMMUNITY)
Admission: RE | Disposition: A | Discharge status: Home / Self Care | Payer: Self-pay | Source: Home / Self Care | Attending: Vascular Surgery

## 2022-11-22 ENCOUNTER — Ambulatory Visit (HOSPITAL_BASED_OUTPATIENT_CLINIC_OR_DEPARTMENT_OTHER): Payer: Medicaid Other | Admitting: Anesthesiology

## 2022-11-22 ENCOUNTER — Other Ambulatory Visit: Payer: Self-pay

## 2022-11-22 ENCOUNTER — Ambulatory Visit (HOSPITAL_COMMUNITY)
Admission: RE | Admit: 2022-11-22 | Discharge: 2022-11-22 | Disposition: A | Discharge status: Home / Self Care | Payer: Medicaid Other | Attending: Vascular Surgery | Admitting: Vascular Surgery

## 2022-11-22 ENCOUNTER — Ambulatory Visit (HOSPITAL_COMMUNITY): Payer: Medicaid Other | Admitting: Anesthesiology

## 2022-11-22 DIAGNOSIS — Z7901 Long term (current) use of anticoagulants: Secondary | ICD-10-CM | POA: Diagnosis not present

## 2022-11-22 DIAGNOSIS — I129 Hypertensive chronic kidney disease with stage 1 through stage 4 chronic kidney disease, or unspecified chronic kidney disease: Secondary | ICD-10-CM | POA: Insufficient documentation

## 2022-11-22 DIAGNOSIS — N185 Chronic kidney disease, stage 5: Secondary | ICD-10-CM | POA: Diagnosis not present

## 2022-11-22 DIAGNOSIS — Z86718 Personal history of other venous thrombosis and embolism: Secondary | ICD-10-CM | POA: Diagnosis not present

## 2022-11-22 DIAGNOSIS — N186 End stage renal disease: Secondary | ICD-10-CM

## 2022-11-22 DIAGNOSIS — N183 Chronic kidney disease, stage 3 unspecified: Secondary | ICD-10-CM | POA: Insufficient documentation

## 2022-11-22 DIAGNOSIS — Z7984 Long term (current) use of oral hypoglycemic drugs: Secondary | ICD-10-CM

## 2022-11-22 DIAGNOSIS — F17210 Nicotine dependence, cigarettes, uncomplicated: Secondary | ICD-10-CM | POA: Insufficient documentation

## 2022-11-22 DIAGNOSIS — E1122 Type 2 diabetes mellitus with diabetic chronic kidney disease: Secondary | ICD-10-CM | POA: Diagnosis not present

## 2022-11-22 DIAGNOSIS — I120 Hypertensive chronic kidney disease with stage 5 chronic kidney disease or end stage renal disease: Secondary | ICD-10-CM | POA: Diagnosis not present

## 2022-11-22 DIAGNOSIS — E119 Type 2 diabetes mellitus without complications: Secondary | ICD-10-CM

## 2022-11-22 DIAGNOSIS — Z992 Dependence on renal dialysis: Secondary | ICD-10-CM

## 2022-11-22 HISTORY — PX: AV FISTULA PLACEMENT: SHX1204

## 2022-11-22 LAB — BASIC METABOLIC PANEL
Anion gap: 13 (ref 5–15)
BUN: 40 mg/dL — ABNORMAL HIGH (ref 6–20)
CO2: 25 mmol/L (ref 22–32)
Calcium: 9 mg/dL (ref 8.9–10.3)
Chloride: 99 mmol/L (ref 98–111)
Creatinine, Ser: 6.24 mg/dL — ABNORMAL HIGH (ref 0.61–1.24)
GFR, Estimated: 10 mL/min — ABNORMAL LOW (ref >60)
Glucose, Bld: 213 mg/dL — ABNORMAL HIGH (ref 70–99)
Potassium: 4.1 mmol/L (ref 3.5–5.1)
Sodium: 137 mmol/L (ref 135–145)

## 2022-11-22 LAB — GLUCOSE, CAPILLARY: Glucose-Capillary: 163 mg/dL — ABNORMAL HIGH (ref 70–99)

## 2022-11-22 LAB — HEMOGLOBIN AND HEMATOCRIT, BLOOD
HCT: 36.7 % — ABNORMAL LOW (ref 39.0–52.0)
Hemoglobin: 11.4 g/dL — ABNORMAL LOW (ref 13.0–17.0)

## 2022-11-22 SURGERY — ARTERIOVENOUS (AV) FISTULA CREATION
Anesthesia: General | Site: Arm Lower | Laterality: Left

## 2022-11-22 MED ORDER — PROPOFOL 10 MG/ML IV BOLUS
INTRAVENOUS | Status: AC
  Filled 2022-11-22: qty 20

## 2022-11-22 MED ORDER — EPHEDRINE 5 MG/ML INJ
INTRAVENOUS | Status: AC
  Filled 2022-11-22: qty 5

## 2022-11-22 MED ORDER — HEPARIN SODIUM (PORCINE) 1000 UNIT/ML IJ SOLN
INTRAMUSCULAR | Status: AC
  Filled 2022-11-22: qty 6

## 2022-11-22 MED ORDER — ONDANSETRON HCL 4 MG/2ML IJ SOLN: 4.0000 mg | Freq: Once | INTRAMUSCULAR | Status: DC | PRN

## 2022-11-22 MED ORDER — ORAL CARE MOUTH RINSE: 15.0000 mL | Freq: Once | OROMUCOSAL | Status: AC

## 2022-11-22 MED ORDER — ONDANSETRON HCL 4 MG/2ML IJ SOLN
INTRAMUSCULAR | Status: AC
  Filled 2022-11-22: qty 2

## 2022-11-22 MED ORDER — CHLORHEXIDINE GLUCONATE 4 % EX LIQD: 60.0000 mL | Freq: Once | CUTANEOUS | Status: DC

## 2022-11-22 MED ORDER — CEFAZOLIN SODIUM-DEXTROSE 2-4 GM/100ML-% IV SOLN
INTRAVENOUS | Status: AC
  Filled 2022-11-22: qty 100

## 2022-11-22 MED ORDER — FENTANYL CITRATE (PF) 100 MCG/2ML IJ SOLN
INTRAMUSCULAR | Status: AC
  Filled 2022-11-22: qty 2

## 2022-11-22 MED ORDER — CEFAZOLIN SODIUM-DEXTROSE 2-4 GM/100ML-% IV SOLN
2.0000 g | INTRAVENOUS | Status: AC
  Administered 2022-11-22: 2 g via INTRAVENOUS

## 2022-11-22 MED ORDER — FENTANYL CITRATE (PF) 100 MCG/2ML IJ SOLN
INTRAMUSCULAR | Status: DC | PRN
  Administered 2022-11-22: 50 ug via INTRAVENOUS

## 2022-11-22 MED ORDER — HEPARIN 6000 UNIT IRRIGATION SOLUTION
Status: DC | PRN
  Administered 2022-11-22: 1

## 2022-11-22 MED ORDER — LIDOCAINE-EPINEPHRINE 0.5 %-1:200000 IJ SOLN
INTRAMUSCULAR | Status: DC | PRN
  Administered 2022-11-22: 12 mL

## 2022-11-22 MED ORDER — SODIUM CHLORIDE 0.9 % IV SOLN: INTRAVENOUS | Status: DC

## 2022-11-22 MED ORDER — LACTATED RINGERS IV SOLN: INTRAVENOUS | Status: DC | PRN

## 2022-11-22 MED ORDER — CHLORHEXIDINE GLUCONATE 0.12 % MT SOLN
15.0000 mL | Freq: Once | OROMUCOSAL | Status: AC
  Administered 2022-11-22: 15 mL via OROMUCOSAL

## 2022-11-22 MED ORDER — GLYCOPYRROLATE 0.2 MG/ML IJ SOLN
INTRAMUSCULAR | Status: DC | PRN
  Administered 2022-11-22: .2 mg via INTRAVENOUS

## 2022-11-22 MED ORDER — MIDAZOLAM HCL 2 MG/2ML IJ SOLN
INTRAMUSCULAR | Status: AC
  Filled 2022-11-22: qty 2

## 2022-11-22 MED ORDER — OXYCODONE-ACETAMINOPHEN 5-325 MG PO TABS: 1.0000 | ORAL_TABLET | Freq: Four times a day (QID) | ORAL | 0 refills | Status: DC | PRN

## 2022-11-22 MED ORDER — LIDOCAINE-EPINEPHRINE 0.5 %-1:200000 IJ SOLN
INTRAMUSCULAR | Status: AC
  Filled 2022-11-22: qty 1

## 2022-11-22 MED ORDER — PROPOFOL 10 MG/ML IV BOLUS
INTRAVENOUS | Status: DC | PRN
  Administered 2022-11-22: 40 mg via INTRAVENOUS
  Administered 2022-11-22: 20 mg via INTRAVENOUS

## 2022-11-22 MED ORDER — LIDOCAINE HCL (PF) 2 % IJ SOLN
INTRAMUSCULAR | Status: AC
  Filled 2022-11-22: qty 5

## 2022-11-22 MED ORDER — MIDAZOLAM HCL 5 MG/5ML IJ SOLN
INTRAMUSCULAR | Status: DC | PRN
  Administered 2022-11-22: 1 mg via INTRAVENOUS

## 2022-11-22 MED ORDER — GLYCOPYRROLATE PF 0.2 MG/ML IJ SOSY
PREFILLED_SYRINGE | INTRAMUSCULAR | Status: AC
  Filled 2022-11-22: qty 1

## 2022-11-22 MED ORDER — GLYCOPYRROLATE PF 0.2 MG/ML IJ SOSY
PREFILLED_SYRINGE | INTRAMUSCULAR | Status: AC
  Filled 2022-11-22: qty 2

## 2022-11-22 MED ORDER — DEXMEDETOMIDINE HCL IN NACL 80 MCG/20ML IV SOLN
INTRAVENOUS | Status: AC
  Filled 2022-11-22: qty 20

## 2022-11-22 MED ORDER — PROPOFOL 500 MG/50ML IV EMUL
INTRAVENOUS | Status: DC | PRN
  Administered 2022-11-22: 150 ug/kg/min via INTRAVENOUS

## 2022-11-22 MED ORDER — ONDANSETRON HCL 4 MG/2ML IJ SOLN
INTRAMUSCULAR | Status: DC | PRN
  Administered 2022-11-22: 4 mg via INTRAVENOUS

## 2022-11-22 MED ORDER — LIDOCAINE HCL (CARDIAC) PF 100 MG/5ML IV SOSY
PREFILLED_SYRINGE | INTRAVENOUS | Status: DC | PRN
  Administered 2022-11-22: 50 mg via INTRAVENOUS

## 2022-11-22 MED ORDER — HYDROMORPHONE HCL 1 MG/ML IJ SOLN: 0.2500 mg | INTRAMUSCULAR | Status: DC | PRN

## 2022-11-22 MED ORDER — 0.9 % SODIUM CHLORIDE (POUR BTL) OPTIME
TOPICAL | Status: DC | PRN
  Administered 2022-11-22: 1000 mL

## 2022-11-22 SURGICAL SUPPLY — 36 items
ADH SKN CLS APL DERMABOND .7 (GAUZE/BANDAGES/DRESSINGS) ×1
ARMBAND PINK RESTRICT EXTREMIT (MISCELLANEOUS) ×1 IMPLANT
CANNULA VESSEL 3MM 2 BLNT TIP (CANNULA) ×1 IMPLANT
CLIP LIGATING EXTRA MED SLVR (CLIP) ×1 IMPLANT
CLIP LIGATING EXTRA SM BLUE (MISCELLANEOUS) ×1 IMPLANT
COVER LIGHT HANDLE STERIS (MISCELLANEOUS) ×2 IMPLANT
COVER MAYO STAND XLG (MISCELLANEOUS) ×1 IMPLANT
DERMABOND ADVANCED .7 DNX12 (GAUZE/BANDAGES/DRESSINGS) ×1 IMPLANT
ELECT REM PT RETURN 9FT ADLT (ELECTROSURGICAL) ×1
ELECTRODE REM PT RTRN 9FT ADLT (ELECTROSURGICAL) ×1 IMPLANT
GAUZE SPONGE 4X4 12PLY STRL (GAUZE/BANDAGES/DRESSINGS) ×1 IMPLANT
GLOVE BIOGEL PI IND STRL 7.0 (GLOVE) ×2 IMPLANT
GLOVE ECLIPSE 6.5 STRL STRAW (GLOVE) IMPLANT
GLOVE SS BIOGEL STRL SZ 6.5 (GLOVE) IMPLANT
GLOVE SURG MICRO LTX SZ7.5 (GLOVE) ×1 IMPLANT
GOWN STRL REUS W/TWL LRG LVL3 (GOWN DISPOSABLE) ×3 IMPLANT
GRAFT GORETEX STRT 4-7X45 (Vascular Products) IMPLANT
IV NS 500ML (IV SOLUTION) ×1
IV NS 500ML BAXH (IV SOLUTION) ×1 IMPLANT
KIT BLADEGUARD II DBL (SET/KITS/TRAYS/PACK) ×1 IMPLANT
KIT TURNOVER KIT A (KITS) ×1 IMPLANT
MANIFOLD NEPTUNE II (INSTRUMENTS) ×1 IMPLANT
MARKER SKIN DUAL TIP RULER LAB (MISCELLANEOUS) ×2 IMPLANT
NDL HYPO 18GX1.5 BLUNT FILL (NEEDLE) ×1 IMPLANT
NEEDLE HYPO 18GX1.5 BLUNT FILL (NEEDLE) ×1 IMPLANT
NS IRRIG 1000ML POUR BTL (IV SOLUTION) ×1 IMPLANT
PACK CV ACCESS (CUSTOM PROCEDURE TRAY) ×1 IMPLANT
PAD ARMBOARD 7.5X6 YLW CONV (MISCELLANEOUS) ×1 IMPLANT
SET BASIN LINEN APH (SET/KITS/TRAYS/PACK) ×1 IMPLANT
SOL PREP POV-IOD 4OZ 10% (MISCELLANEOUS) ×1 IMPLANT
SOL PREP PROV IODINE SCRUB 4OZ (MISCELLANEOUS) ×1 IMPLANT
SUT PROLENE 6 0 CC (SUTURE) ×1 IMPLANT
SUT VIC AB 3-0 SH 27 (SUTURE) ×1
SUT VIC AB 3-0 SH 27X BRD (SUTURE) ×1 IMPLANT
SYR 10ML LL (SYRINGE) ×1 IMPLANT
UNDERPAD 30X36 HEAVY ABSORB (UNDERPADS AND DIAPERS) ×1 IMPLANT

## 2022-11-22 NOTE — Discharge Instructions (Signed)
   [facility]  Discharge Instructions  AV Fistula or Graft Surgery for Dialysis Access  Please refer to the following instructions for your post-procedure care. Your surgeon or physician assistant will discuss any changes with you.  Activity  You may drive the day following your surgery, if you are comfortable and no longer taking prescription pain medication. Resume full activity as the soreness in your incision resolves.  Bathing/Showering  You may shower after you go home. Keep your incision dry for 48 hours. Do not soak in a bathtub, hot tub, or swim until the incision heals completely. You may not shower if you have a hemodialysis catheter.  Incision Care  Clean your incision with mild soap and water after 48 hours. Pat the area dry with a clean towel. You do not need a bandage unless otherwise instructed. Do not apply any ointments or creams to your incision. You may have skin glue on your incision. Do not peel it off. It will come off on its own in about one week. Your arm may swell a bit after surgery. To reduce swelling use pillows to elevate your arm so it is above your heart. Your doctor will tell you if you need to lightly wrap your arm with an ACE bandage.  Diet  Resume your normal diet. There are not special food restrictions following this procedure. In order to heal from your surgery, it is CRITICAL to get adequate nutrition. Your body requires vitamins, minerals, and protein. Vegetables are the best source of vitamins and minerals. Vegetables also provide the perfect balance of protein. Processed food has little nutritional value, so try to avoid this.  Medications  Resume taking all of your medications. If your incision is causing pain, you may take over-the counter pain relievers such as acetaminophen (Tylenol). If you were prescribed a stronger pain medication, please be aware these medications can cause nausea and constipation. Prevent  nausea by taking the medication with a snack or meal. Avoid constipation by drinking plenty of fluids and eating foods with high amount of fiber, such as fruits, vegetables, and grains.  Do not take Tylenol if you are taking prescription pain medications.  Follow up Your surgeon may want to see you in the office following your access surgery. If so, this will be arranged at the time of your surgery.  Please call us immediately for any of the following conditions:  Increased pain, redness, drainage (pus) from your incision site Fever of 101 degrees or higher Severe or worsening pain at your incision site Hand pain or numbness.  Reduce your risk of vascular disease:  Stop smoking. If you would like help, call [facility] at 1-800-QUIT-NOW 606-067-1760) or [place] at [place] your cholesterol Maintain a desired weight Control your diabetes Keep your blood pressure down  Dialysis  It will take several weeks to several months for your new dialysis access to be ready for use. Your surgeon will determine when it is okay to use it. Your nephrologist will continue to direct your dialysis. You can continue to use your Permcath until your new access is ready for use.   11/22/2022 Jesse Nguyen [id number] February 15, 1971  Surgeon(s): Malayasia Mirkin, Arvilla Meres, MD  Procedure(s): LEFT ARM GRAFT CREATION   May stick graft immediately   May stick graft on designated area only:    Do not stick graft for 4 weeks    If you have any questions, please call the office at [phone].

## 2022-11-22 NOTE — Interval H&P Note (Signed)
History and Physical Interval Note:  11/22/2022 7:16 AM  Jesse Nguyen  has presented today for surgery, with the diagnosis of ESRD.  The various methods of treatment have been discussed with the patient and family. After consideration of risks, benefits and other options for treatment, the patient has consented to  Procedure(s): LEFT ARM ARTERIOVENOUS (AV) FISTULA VERSUS GRAFT CREATION (Left) as a surgical intervention.  The patient's history has been reviewed, patient examined, no change in status, stable for surgery.  I have reviewed the patient's chart and labs.  Questions were answered to the patient's satisfaction.     Curt Jews

## 2022-11-22 NOTE — Anesthesia Postprocedure Evaluation (Signed)
Anesthesia Post Note  Patient: Jesse Nguyen  Procedure(s) Performed: LEFT ARM GRAFT CREATION (Left: Arm Lower)  Patient location during evaluation: Phase II Anesthesia Type: General Level of consciousness: awake and alert and oriented Pain management: pain level controlled Vital Signs Assessment: post-procedure vital signs reviewed and stable Respiratory status: spontaneous breathing, nonlabored ventilation and respiratory function stable Cardiovascular status: blood pressure returned to baseline and stable Postop Assessment: no apparent nausea or vomiting Anesthetic complications: no  No notable events documented.   Last Vitals:  Vitals:   11/22/22 0945 11/22/22 0952  BP: 139/78 109/77  Pulse: (!) 58 66  Resp: 13 13  Temp:  (!) 36.4 C  SpO2: 100% 100%    Last Pain:  Vitals:   11/22/22 0952  TempSrc: Oral  PainSc: 0-No pain                 Graycie Halley C Aimee Heldman

## 2022-11-22 NOTE — Transfer of Care (Signed)
Immediate Anesthesia Transfer of Care Note  Patient: Jesse Nguyen  Procedure(s) Performed: LEFT ARM GRAFT CREATION (Left: Arm Lower)  Patient Location: PACU  Anesthesia Type:MAC  Level of Consciousness: awake, alert , oriented, and patient cooperative  Airway & Oxygen Therapy: Patient Spontanous Breathing  Post-op Assessment: Report given to RN, Post -op Vital signs reviewed and stable, and Patient moving all extremities X 4  Post vital signs: Reviewed and stable  Last Vitals:  Vitals Value Taken Time  BP 115/74 11/22/22 0918  Temp    Pulse 61 11/22/22 0922  Resp 15 11/22/22 0922  SpO2 100 % 11/22/22 0922  Vitals shown include unvalidated device data.  Last Pain:  Vitals:   11/22/22 0645  PainSc: 6          Complications: No notable events documented.

## 2022-11-22 NOTE — Op Note (Signed)
    OPERATIVE REPORT  DATE OF SURGERY: 11/22/2022  PATIENT: Jesse Nguyen, 51 y.o. male MRN: [id number]  DOB: [birth year]  PRE-OPERATIVE DIAGNOSIS: Stage renal disease  POST-OPERATIVE DIAGNOSIS:  Same  PROCEDURE: Left forearm loop AV Gore-Tex graft  SURGEON:  Curt Jews, M.D.  PHYSICIAN ASSISTANT: Nurse  The assistant was needed for exposure and to expedite the case  ANESTHESIA: MAC  EBL: per anesthesia record  Total I/O In: 550 [I.V.:450; IV Piggyback:100] Out: 5 [Blood:5]  BLOOD ADMINISTERED: none  DRAINS: none  SPECIMEN: none  COUNTS CORRECT:  YES  PATIENT DISPOSITION:  PACU - hemodynamically stable  PROCEDURE DETAILS: The patient was taken operating placed to position area of the left arm from draped you sterile fashion patient had a small cephalic and basilic vein which were inadequate for fistula creation.  Incision was made over the brachial artery pulse at the antecubital space.  The patient did have a good caliber brachial artery and did have a good caliber brachial vein alongside.  Decision was made to place a forearm loop graft.  A separate incision was made at the distal forearm.  A loop configuration tunnel was created and a 4-7 taper graft was brought through the tunnel.  The arterial limb was on the radial aspect and the venous limb was on the ulnar aspect.  The artery was occluded proximally and distally with fistula clamps.  The artery was opened with an 11 blade and sent longitudinally with Potts scissors.  A mild arteriotomy was created to reduce risk of steal.  The 4 mm portion of the graft was spatulated at approximately the 5 mm segment and this was sewn end-to-side to the artery with a running 6-0 Prolene suture.  This anastomosis was tested and found to be adequate.  The graft was flushed with heparinized saline and reoccluded.  Next the brachial vein was occluded proximally and distally and was opened with an 11 blade and sent longitudinally with Potts  scissors.  The graft was cut to the appropriate length and spatulated and the 7 mm portion of the graft was sewn to the vein in a running fashion with 6-0 Prolene.  Was removed and good thrill was noted in the graft.  The wounds were irrigated with saline.  Hemostasis was obtained electrocautery.  The wounds were closed with 3-0 Vicryl in the subcutaneous and subcuticular tissue.  Sterile dressing was applied and the patient was transferred to the recovery room in stable condition   Rosetta Posner, M.D., [facility] 11/22/2022 9:20 AM  Note: Portions of this report may have been transcribed using voice recognition software.  Every effort has been made to ensure accuracy; however, inadvertent computerized transcription errors may still be present.

## 2022-11-22 NOTE — Anesthesia Preprocedure Evaluation (Signed)
Anesthesia Evaluation  Patient identified by MRN, date of birth, ID band Patient awake    Reviewed: Allergy & Precautions, H&P , NPO status , Patient's Chart, lab work & pertinent test results  Airway Mallampati: III  TM Distance: >3 FB Neck ROM: Full    Dental  (+) Dental Advisory Given, Missing, Chipped   Pulmonary Current Smoker and Patient abstained from smoking.   Pulmonary exam normal breath sounds clear to auscultation       Cardiovascular hypertension, Pt. on medications Normal cardiovascular exam Rhythm:Regular Rate:Normal  CONCLUSIONS:  1.  Mild left ventricular hypertrophy 2.  Mild biatrial enlargement 3.  Moderate size bilateral pleural effusions 4.  There is patchy LGE in the basal segments excluding the anterior lateral basal segment. There is associated increased T1 signal in the affected corresponding segments. 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 Recommend further laboratory evaluation and pyrophosphate scan to determine potential etiology of amyloidosis (ATTR versus AL amyloidosis) 5.  LV normal size and function. LVEF 56 % 6.  RV normal size and function. RVEF 48 % 7.  No significant valvular abnormalities. Exam End: 09/21/22 12:24  Specimen Collected: 09/21/22 15:16 Last Resulted: 09/21/22 16:44 Received From: [place]  Result Received: 10/10/22 15:04     Neuro/Psych negative neurological ROS  negative psych ROS   GI/Hepatic negative GI ROS,,,(+) Hepatitis -, C  Endo/Other  negative endocrine ROSdiabetes, Well Controlled, Type 2, Oral Hypoglycemic Agents    Renal/GU ESRF and DialysisRenal disease  negative genitourinary   Musculoskeletal negative musculoskeletal ROS (+)    Abdominal   Peds negative pediatric ROS (+)  Hematology negative hematology ROS (+)   Anesthesia Other Findings    Reproductive/Obstetrics negative OB ROS                             Anesthesia Physical Anesthesia Plan  ASA: 3  Anesthesia Plan: General   Post-op Pain Management: Dilaudid IV   Induction: Intravenous  PONV Risk Score and Plan: Ondansetron and Propofol infusion  Airway Management Planned: Nasal Cannula, Natural Airway and Simple Face Mask  Additional Equipment:   Intra-op Plan:   Post-operative Plan:   Informed Consent: I have reviewed the patients History and Physical, chart, labs and discussed the procedure including the risks, benefits and alternatives for the proposed anesthesia with the patient or authorized representative who has indicated his/her understanding and acceptance.     Dental advisory given  Plan Discussed with: CRNA and Surgeon  Anesthesia Plan Comments: (Possible GA with airway was discussed.)        Anesthesia Quick Evaluation

## 2022-11-28 ENCOUNTER — Telehealth: Payer: Self-pay | Admitting: Vascular Surgery

## 2022-11-28 NOTE — Telephone Encounter (Deleted)
-----   Message from Rosetta Posner, MD sent at 11/22/2022  9:23 AM EST -----  Left forearm loop AV Gore-Tex graft placement.  I need to see him in the [place] office in 1 month for follow-up  [place] had removal of tunneled dialysis catheter.  Does not need follow-up

## 2022-11-30 ENCOUNTER — Encounter (HOSPITAL_COMMUNITY): Payer: Self-pay | Admitting: Vascular Surgery

## 2022-12-08 NOTE — Telephone Encounter (Signed)
-----   Message from Rosetta Posner, MD sent at 11/22/2022  9:23 AM EST -----  Left forearm loop AV Gore-Tex graft placement.  I need to see him in the [place] office in 1 month for follow-up    Appt scheduled

## 2022-12-28 ENCOUNTER — Encounter: Payer: Medicaid Other | Admitting: Vascular Surgery

## 2023-01-04 ENCOUNTER — Encounter: Payer: Self-pay | Admitting: Vascular Surgery

## 2023-01-04 ENCOUNTER — Ambulatory Visit (INDEPENDENT_AMBULATORY_CARE_PROVIDER_SITE_OTHER): Payer: Medicaid Other | Admitting: Vascular Surgery

## 2023-01-04 VITALS — BP 165/77 | HR 86 | Temp 97.5°F | Ht 72.0 in | Wt 179.0 lb

## 2023-01-04 DIAGNOSIS — N186 End stage renal disease: Secondary | ICD-10-CM

## 2023-01-04 DIAGNOSIS — Z992 Dependence on renal dialysis: Secondary | ICD-10-CM

## 2023-01-04 NOTE — Progress Notes (Signed)
[facility] and Vein Specialist of [place]  Patient name: Jesse Nguyen MRN: [id number] DOB: June 04, 1971 Sex: male  REASON FOR VISIT: Follow-up left arm access  HPI: Jesse Nguyen is a 52 y.o. male here today for follow-up.  He underwent left forearm loop AV Gore-Tex graft by myself in the hospital on 07/22/2022.  He is currently dialyzed via right IJ tunneled catheter that was placed at [facility].  Reports that he is having difficulty with runs with clogging of his catheter also reports an achy sensation with his catheter.  Current Outpatient Medications  Medication Sig Dispense Refill   ascorbic acid (VITAMIN C) 500 MG tablet Take 1,000 mg by mouth 2 (two) times daily.     Aspirin-Acetaminophen-Caffeine (GOODY HEADACHE PO) Take 1-2 packets by mouth daily as needed (pain).     bismuth subsalicylate (PEPTO BISMOL) 262 MG/15ML suspension Take 30 mLs by mouth every 6 (six) hours as needed for diarrhea or loose stools or indigestion.     cyanocobalamin (VITAMIN B12) 1000 MCG tablet Take 1 tablet by mouth daily.     gabapentin (NEURONTIN) 100 MG capsule Take by mouth.     sitaGLIPtin (JANUVIA) 100 MG tablet Take 50 mg by mouth 2 (two) times daily.     amLODipine (NORVASC) 5 MG tablet Take 1 tablet (5 mg total) by mouth daily. 30 tablet 0   amoxicillin-clavulanate (AUGMENTIN XR) 1000-62.5 MG 12 hr tablet Take 2 tablets by mouth 2 (two) times daily. (Patient not taking: Reported on 01/04/2023)     carvedilol (COREG) 25 MG tablet Take 25 mg by mouth. (Patient not taking: Reported on 01/04/2023)     hydrALAZINE (APRESOLINE) 50 MG tablet Take 50 mg by mouth daily. (Patient not taking: Reported on 01/04/2023)     lisinopril (ZESTRIL) 20 MG tablet Take 20 mg by mouth 2 (two) times daily. (Patient not taking: Reported on 01/04/2023)     oxyCODONE-acetaminophen (PERCOCET) 5-325 MG tablet Take 1 tablet by mouth every 6 (six) hours as needed for severe pain. (Patient not taking:  Reported on 01/04/2023) 8 tablet 0   Sodium Hypochlorite (HYSEPT) 0.25 % SOLN Apply 1 application. topically 2 (two) times daily.     Torsemide 40 MG TABS Take 80 mg by mouth 2 (two) times daily. (Patient not taking: Reported on 01/04/2023)     warfarin (COUMADIN) 2.5 MG tablet  (Patient not taking: Reported on 01/04/2023)     warfarin (COUMADIN) 5 MG tablet Take half tablet (2.5 mg) on 10/1 and 10/2. Upcoming doses will be determined by warfarin clinic. If unable to schedule clinic appointment on 10/3, take 5 mg that day and call your PCP for further instructions. (Patient not taking: Reported on 01/04/2023)     No current facility-administered medications for this visit.     PHYSICAL EXAM: Vitals:   01/04/23 1326  BP: (!) 165/77  Pulse: 86  Temp: (!) 97.5 F (36.4 C)  SpO2: 100%  Weight: 179 lb (81.2 kg)  Height: 6' (1.829 m)    GENERAL: The patient is a well-nourished male, in no acute distress. The vital signs are documented above. Left forearm loop AV Gore-Tex graft well-healed with no evidence of infection.  Antecubital incision is well-healed.  Excellent thrill.  MEDICAL ISSUES: Stable status postplacement of left forearm loop graft on 11/22/2022.  He is okay to use the graft at any time.  I will notify the [facility] dialysis center.  We are available for removal of his catheter after several successful sessions of dialysis with  his left arm graft.   Rosetta Posner, MD FACS [facility] [phone]  Note: Portions of this report may have been transcribed using voice recognition software.  Every effort has been made to ensure accuracy; however, inadvertent computerized transcription errors may still be present.

## 2023-04-02 NOTE — Progress Notes (Signed)
 [facility] ESRD PROGRESS NOTE:  ESRD  Date of service: 04/02/2023  INTERVAL HISTORY/SUBJECTIVE:  Overnight/24hr events: None Well dialyzed (4/6/) 0.5 Kg removed ; posttx weight 76.6 kg BP stable 148~130, afebrile  Today's wt 76. 9kg Patient has no complaint this morning   REVIEW OF SYSTEMS All systems reviewed and are negative except above HPI   SYNOPSIS (from initial consultation note) Jesse Nguyen is a 52 y.o. 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 He still produces urine. Denies dysuria, gross hematuria, or malodorous urine. Endorses chills and right foot pain. Denies chest pain, shortness of breath, N/V/D. Last dialyzed on Tuesday. Nephrology has been consulted for evaluation and management for ESRD.    Outpatient Dialysis Prescription updated via unit on 03/30/2023  Nephrologist [place]  HD unit [facility] [phone])  Schedule TTS  Access   L AVF  Time 4 hours  Qb/Qd 300/500 mL/min  Dialyzer Nipro Elisio 17h  DW 79 kg  Bath 2K/2.5Ca  Heparin  None     VitD Analogue 0.5 mcg every treatment  Calcimimetic None  ESA Mircera 75 mcg every 2 weeks  IV iron Venofer 50 mg weekly on 1st treatment Sevelamer 1600 mg TID with meals as phos binder  Date of Inpatient Last Consent 09/20/2022    CURRENT MEDICATIONS acetaminophen , 1,000 mg, oral, Q6H amLODIPine , 5 mg, oral, Daily apixaban, 5 mg, oral, BID atorvastatin, 40 mg, oral, At Bedtime carvediloL, 6.25 mg, oral, BID with meals clopidogreL, 75 mg, oral, Daily cyanocobalamin, 1,000 mcg, oral, Daily darbepoetin alfa , 50 mcg, subcutaneous, Weekly gabapentin , 300 mg, oral,  Nightly hydrALAZINE , 50 mg, oral, TID insulin aspart (NovoLOG)/insulin lispro (HumaLOG) injection (WF), 0-12 Units, subcutaneous, TID PC omeprazole, 20 mg, oral, Daily sevelamer, 1,600 mg, oral, TID with meals    PHYSICAL EXAMINATION Vitals:   04/02/23 0500 04/02/23 0545 04/02/23 0718 04/02/23 0726  BP:    148/64  BP Location:    Right arm  Patient Position:    Lying  Pulse:    73  Resp:  16 18 18   Temp:    98.7 F (37.1 C)  TempSrc:    Oral  SpO2:    99%  Weight: 76.9 kg (169 lb 8.5 oz)     Height:       @4DAYWTS @ @IOLAST2SHIFTS @   Constitutional: Alert, no apparent distress Cardiovascular: Regular rhythm, normal s1/s2 heart sounds, no murmurs, no rubs, no BLE edema Pulmonary: Clear on auscultation, non-labored respiratory effort on room air Gastrointestinal: Active bowel sounds. Abdomen soft, non-tender, non-distended. 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)  Bearable pain and stable postop conditions  Stable on IHD, euvolemic (under his dwt now) Nephrology consulted for following problems:   # End-Stage Renal Disease: on outpatient hemodialysis 3x weekly TTS, stable on iHD., ok to keep thrice weekly tx: TTS Recent Labs    03/30/23 1953 03/31/23 0546  04/01/23 0707  BUN 25 29* 26*  CREATININE 4.92* 5.93* 5.71*  Access:  L AVF  # Electrolytes and Acid/Base: Recent Labs    03/30/23 1953 03/31/23 0546 04/01/23 0707  NA 135* 136 135*  K 4.6 4.5 4.0  CL 97* 98 99  CO2 27 27 27   ANIONGAP 11 11 9   MG  --  2.0 1.8*  Sodium: Acceptable Potassium: Acceptable Magnesium: Acceptable Acid/Base: Acceptable  # Mineral Bone Parameters: Recent  Labs    03/30/23 1953 03/31/23 0546 04/01/23 0707  CALCIUM  8.4* 8.1* 7.8*  PHOS  --  5.5* 5.2*   Lab Results  Component Value Date   ALBUMIN 3.1 (L) 03/29/2023   PTH 339 (H) 03/31/2023  Calcium : Acceptable after correction for hypoalbuminemia Phosphorus: Hyperphosphatemia, mild Secondary hyperparathyroidism: acceptable ; keep calcitriol  0.5 mcg on HD days   # Nutrition Lab Results  Component Value Date   ALBUMIN 3.1 (L) 03/29/2023   ALBUMIN 3.3 (L) 03/20/2023   ALBUMIN 3.3 (L) 02/07/2023   ALBUMIN 3.9 02/01/2023      Dietary Orders  (From admission, onward)               Ordered    Adult Diet- Renal; Dialysis (K/Phos/Na restricted)  Diet effective now       References:    Medical Nutrition Management (MNM) for Registered Dietitian  Question Answer Comment  Diet type: Renal   Renal Restriction: Dialysis (K/Phos/Na restricted)   Medical Nutrition Management By RD Yes, Medical Nutrition Management By RD      03/31/23 1816          Acceptable albumin  # Anemia of CKD: below goal  Recent Labs    03/31/23 0546 04/01/23 0707  HGB 9.4* 8.5*   Lab Results  Component Value Date   IRONSAT 6 (L) 03/19/2022   FERRITIN 271 03/30/2023   IRON 28 (L) 03/30/2023  - Goal hemoglobin 10-11.5 - Transfusion support per primary team when Hgb <7 - ESA: Darbepoetin q week, anticipating resistance d/t ongoing acute on chronic inflammation   # Volume status:  Euvolemia, ok to keep 76.5 kg as a new dwt # Blood pressure: Normotensive on current regimen # Medication dosing. Reviewed for current GFR, see any recommended changes below.   ============================================ RECOMMENDATIONS - No acute indication for dialysis today. Plan for HD on Tuesday - Start Calcitriol  0.5 mcg P.O. today and schedule on Tuesday, Thursday and Saturday - Continue Sevelamer 1600 mg TID with meals as phos binder - Continue Aranesp  50 mcg weekly for anemia. Please discontinue at  discharge as pt will receive ESA at outpatient HD unit  - Please obtain daily labs (BMP, CBC, Mag, Phos) - Daily weights and strict I&Os - Dialysis diet (low K, low phos) and 1500 mL fluid restriction - Renally dose medications based on CrCl < 10  Bookback orders  Change in dialyzer No   Change in target weight No   Change in heparin  dosing No   IV antibiotics at dialysis No   New labwork/INR at dialysis No   COVID-19 testing No   Other Aranesp  50 mcg, last administered on 4/4     Please page the covering provider for the [facility] ESRD service listed in [facility] with any questions regarding this patient.

## 2023-07-26 ENCOUNTER — Encounter: Payer: Self-pay | Admitting: *Deleted

## 2023-10-03 NOTE — Procedures (Signed)
 I was seen and examined the pateint and and supervised the hemodialysis procedure around 5 pm   Diagnosis: ESRD(TTS)  Being hospitalized for right stump wound management  Nephrology providing maintenance HD  HD order reviewed and adjusted based on labs and vital sign Accessed via  Left AVF Qb achieved ~350 ml/min UF goal: 4 L/  4hrs;  BP: 140~150 stable with current UF rate  The patient is tolerating the procedure well. Next treatment is Thursday  Electronically signed by: Galen Ruth, MD 10/03/2023 5:00 PM

## 2023-10-13 ENCOUNTER — Emergency Department (HOSPITAL_COMMUNITY)
Admission: EM | Admit: 2023-10-13 | Discharge: 2023-10-13 | Disposition: A | Discharge status: Home / Self Care | Payer: Medicaid Other | Attending: Emergency Medicine | Admitting: Emergency Medicine

## 2023-10-13 ENCOUNTER — Encounter (HOSPITAL_COMMUNITY): Payer: Self-pay

## 2023-10-13 DIAGNOSIS — N186 End stage renal disease: Secondary | ICD-10-CM | POA: Insufficient documentation

## 2023-10-13 DIAGNOSIS — T8743 Infection of amputation stump, right lower extremity: Secondary | ICD-10-CM | POA: Diagnosis present

## 2023-10-13 DIAGNOSIS — Z4800 Encounter for change or removal of nonsurgical wound dressing: Secondary | ICD-10-CM | POA: Insufficient documentation

## 2023-10-13 DIAGNOSIS — M7989 Other specified soft tissue disorders: Secondary | ICD-10-CM | POA: Insufficient documentation

## 2023-10-13 DIAGNOSIS — T8740 Infection of amputation stump, unspecified extremity: Secondary | ICD-10-CM

## 2023-10-13 DIAGNOSIS — Z7901 Long term (current) use of anticoagulants: Secondary | ICD-10-CM | POA: Diagnosis not present

## 2023-10-13 MED ORDER — DOXYCYCLINE HYCLATE 100 MG PO TABS
100.0000 mg | ORAL_TABLET | Freq: Once | ORAL | Status: AC
  Administered 2023-10-13: 100 mg via ORAL
  Filled 2023-10-13: qty 1

## 2023-10-13 MED ORDER — DOXYCYCLINE HYCLATE 100 MG PO CAPS: 100.0000 mg | ORAL_CAPSULE | Freq: Two times a day (BID) | ORAL | 0 refills | Status: DC

## 2023-10-13 NOTE — ED Provider Notes (Signed)
[place] EMERGENCY DEPARTMENT AT [facility] Provider Note   CSN: [id number] Arrival date & time: 10/13/23  2146     History  Chief Complaint  Patient presents with   Wound Check    Jesse Nguyen is a 52 y.o. male.   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  He finished his antibiotics and did well, today felt like the stump was a little bit swollen so he comes in for evaluation of that.  He denies fevers or chills and states that the swelling is gotten significantly better throughout the day.    Home Medications Prior to Admission medications   Medication Sig Start Date End Date Taking? Authorizing Provider  doxycycline (VIBRAMYCIN) 100 MG capsule Take 1 capsule (100 mg total) by mouth 2 (two) times daily. 10/13/23  Yes Eber Hong, MD  amLODipine (NORVASC) 5 MG tablet Take 1 tablet (5 mg total) by mouth daily. 04/21/20 11/09/22  Namon Cirri E, PA-C  amoxicillin-clavulanate (AUGMENTIN XR) 1000-62.5 MG 12 hr tablet Take 2 tablets by mouth 2 (two) times daily. Patient not taking: Reported on 01/04/2023    [provider]  ascorbic acid (VITAMIN C) 500 MG tablet Take 1,000 mg by mouth 2 (two) times daily.    [provider]  Aspirin-Acetaminophen-Caffeine (GOODY HEADACHE PO) Take 1-2 packets by mouth daily as needed (pain).    [provider]  bismuth subsalicylate (PEPTO BISMOL) 262 MG/15ML suspension Take 30 mLs by mouth every 6 (six) hours as needed for diarrhea or loose stools or indigestion.    [provider]  carvedilol (COREG) 25 MG tablet Take 25 mg by mouth. Patient not taking: Reported on 01/04/2023 08/09/22   [provider]  cyanocobalamin (VITAMIN B12) 1000 MCG  tablet Take 1 tablet by mouth daily. 04/04/22   [provider]  gabapentin (NEURONTIN) 100 MG capsule Take by mouth. 06/22/22   [provider]  hydrALAZINE (APRESOLINE) 50 MG tablet Take 50 mg by mouth daily. Patient not taking: Reported on 01/04/2023 08/19/21   [provider]  lisinopril (ZESTRIL) 20 MG tablet Take 20 mg by mouth 2 (two) times daily. Patient not taking: Reported on 01/04/2023    [provider]  oxyCODONE-acetaminophen (PERCOCET) 5-325 MG tablet Take 1 tablet by mouth every 6 (six) hours as needed for severe pain. Patient not taking: Reported on 01/04/2023 11/22/22   Early, Kristen Loader, MD  sitaGLIPtin (JANUVIA) 100 MG tablet Take 50 mg by mouth 2 (two) times daily.    [provider]  Sodium Hypochlorite (HYSEPT) 0.25 % SOLN Apply 1 application. topically 2 (two) times daily.    [provider]  Torsemide 40 MG TABS Take 80 mg by mouth 2 (two) times daily. Patient not taking: Reported on 01/04/2023    [provider]  warfarin (COUMADIN) 2.5 MG tablet  09/25/22   [provider]  warfarin (COUMADIN) 5 MG tablet Take half tablet (2.5 mg) on 10/1 and 10/2. Upcoming doses will be determined by warfarin clinic. If unable to schedule clinic appointment on 10/3, take 5 mg that day and call your PCP for further instructions. Patient not taking: Reported on 01/04/2023 09/25/22   [provider]      Allergies  Other    Review of Systems   Review of Systems  All other systems reviewed and are negative.   Physical Exam Updated Vital Signs BP (!) 155/85   Pulse 80   Temp 98.7 F (37.1 C) (Oral)   Ht 1.829 m (6')   Wt 86.4 kg   SpO2 100%   BMI 25.83 kg/m  Physical Exam Vitals and nursing note reviewed.  Constitutional:      General: He is not in acute distress.    Appearance: He is well-developed.  HENT:     Head: Normocephalic and atraumatic.     Mouth/Throat:     Pharynx: No oropharyngeal  exudate.  Eyes:     General: No scleral icterus.       Right eye: No discharge.        Left eye: No discharge.     Conjunctiva/sclera: Conjunctivae normal.     Pupils: Pupils are equal, round, and reactive to light.  Neck:     Thyroid: No thyromegaly.     Vascular: No JVD.  Cardiovascular:     Rate and Rhythm: Normal rate and regular rhythm.     Heart sounds: Normal heart sounds. No murmur heard.    No friction rub. No gallop.     Comments: Dialysis access left forearm normal thrill, no redness Pulmonary:     Effort: Pulmonary effort is normal. No respiratory distress.     Breath sounds: Normal breath sounds. No wheezing or rales.  Abdominal:     General: Bowel sounds are normal. There is no distension.     Palpations: Abdomen is soft. There is no mass.     Tenderness: There is no abdominal tenderness.  Musculoskeletal:        General: No tenderness. Normal range of motion.     Cervical back: Normal range of motion and neck supple.  Lymphadenopathy:     Cervical: No cervical adenopathy.  Skin:    General: Skin is warm and dry.     Findings: No erythema or rash.     Comments: Skin exam of the right lower extremity of the stump shows an open wound which is clean, small amount of serous drainage, there is no redness warmth or significant swelling of the end of the stump.  Neurological:     Mental Status: He is alert.     Coordination: Coordination normal.  Psychiatric:        Behavior: Behavior normal.     ED Results / Procedures / Treatments   Labs (all labs ordered are listed, but only abnormal results are displayed) Labs Reviewed - No data to display  EKG None  Radiology No results found.  Procedures Procedures    Medications Ordered in ED Medications  doxycycline (VIBRA-TABS) tablet 100 mg (has no administration in time range)    ED Course/ Medical Decision Making/ A&P                                 Medical Decision Making Risk Prescription drug  management.   This patient is in no distress, his vital signs reflect no fever or tachycardia, he is not hypotensive, he has dialysis in the morning, I will start him on doxycycline which is not renally cleared, he will take this twice a day and follow-up with his doctor just in case he has early infection.  He states that the swelling is gone down, I do  not see any other reason for it to be swollen and it does not appear to have an abscess or need to have further imaging or evaluation or stabilizing care.  The patient is agreeable.        Final Clinical Impression(s) / ED Diagnoses Final diagnoses:  Amputation stump infection (HCC)    Rx / DC Orders ED Discharge Orders          Ordered    doxycycline (VIBRAMYCIN) 100 MG capsule  2 times daily        10/13/23 2238              Eber Hong, MD 10/13/23 2240

## 2023-10-13 NOTE — ED Triage Notes (Signed)
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 Pt states that his wound has become swollen and more tender than usual and wanted to come in for it to be checked.

## 2023-10-13 NOTE — Discharge Instructions (Signed)
Please make sure that you go to dialysis tomorrow, do not miss dialysis, start taking doxycycline twice a day, take it for 10 days, have your surgeon repeat your exam within 1 week to make sure things are getting better, if you develop severe swelling pus or redness return to the ER immediately.

## 2024-01-26 ENCOUNTER — Encounter (INDEPENDENT_AMBULATORY_CARE_PROVIDER_SITE_OTHER): Payer: Self-pay | Admitting: *Deleted

## 2024-03-04 ENCOUNTER — Ambulatory Visit (HOSPITAL_COMMUNITY)
Admission: RE | Admit: 2024-03-04 | Discharge: 2024-03-04 | Disposition: A | Discharge status: Home / Self Care | Payer: Medicaid Other | Source: Ambulatory Visit | Attending: Internal Medicine | Admitting: Internal Medicine

## 2024-03-04 ENCOUNTER — Other Ambulatory Visit: Payer: Self-pay

## 2024-03-04 ENCOUNTER — Encounter (HOSPITAL_COMMUNITY)
Admission: RE | Disposition: A | Discharge status: Home / Self Care | Payer: Self-pay | Source: Ambulatory Visit | Attending: Internal Medicine

## 2024-03-04 SURGERY — A/V SHUNT INTERVENTION
Anesthesia: LOCAL

## 2024-03-04 NOTE — Plan of Care (Signed)
 15M ESRD on HD [facility] referred for abnormal physical exam of his L FA AVF created 09/2022.  It's required no intervention to date.    A whistle was noted on exam last week but was not auscultated on Saturday per patient.  He reports NO issues with cannulation, pressure, prolonged bleeding.  On my exam AVG has a nice bruit and thrill.  Normal auscultation.   Assessment/Recommendation:   -Normal physical exam.   -Continue usual care of AVG.   -Refer back with any new issues.   Estill Bakes MD [facility] Pager [phone]

## 2024-05-23 ENCOUNTER — Other Ambulatory Visit: Payer: Self-pay

## 2024-05-23 ENCOUNTER — Ambulatory Visit (HOSPITAL_COMMUNITY)
Admission: RE | Admit: 2024-05-23 | Discharge: 2024-05-23 | Disposition: A | Discharge status: Home / Self Care | Source: Ambulatory Visit | Attending: Vascular Surgery | Admitting: Vascular Surgery

## 2024-05-23 ENCOUNTER — Encounter (HOSPITAL_COMMUNITY)
Admission: RE | Disposition: A | Discharge status: Home / Self Care | Payer: Self-pay | Source: Ambulatory Visit | Attending: Vascular Surgery

## 2024-05-23 DIAGNOSIS — Z992 Dependence on renal dialysis: Secondary | ICD-10-CM | POA: Diagnosis not present

## 2024-05-23 DIAGNOSIS — N186 End stage renal disease: Secondary | ICD-10-CM | POA: Diagnosis not present

## 2024-05-23 DIAGNOSIS — T82858A Stenosis of vascular prosthetic devices, implants and grafts, initial encounter: Secondary | ICD-10-CM

## 2024-05-23 DIAGNOSIS — E1122 Type 2 diabetes mellitus with diabetic chronic kidney disease: Secondary | ICD-10-CM | POA: Insufficient documentation

## 2024-05-23 DIAGNOSIS — F17210 Nicotine dependence, cigarettes, uncomplicated: Secondary | ICD-10-CM | POA: Insufficient documentation

## 2024-05-23 DIAGNOSIS — Y832 Surgical operation with anastomosis, bypass or graft as the cause of abnormal reaction of the patient, or of later complication, without mention of misadventure at the time of the procedure: Secondary | ICD-10-CM | POA: Diagnosis not present

## 2024-05-23 DIAGNOSIS — I120 Hypertensive chronic kidney disease with stage 5 chronic kidney disease or end stage renal disease: Secondary | ICD-10-CM | POA: Diagnosis not present

## 2024-05-23 DIAGNOSIS — T82868A Thrombosis of vascular prosthetic devices, implants and grafts, initial encounter: Secondary | ICD-10-CM | POA: Diagnosis not present

## 2024-05-23 HISTORY — PX: A/V SHUNT INTERVENTION: CATH118220

## 2024-05-23 HISTORY — PX: VENOUS ANGIOPLASTY: CATH118376

## 2024-05-23 HISTORY — PX: PERIPHERAL VASCULAR THROMBECTOMY: CATH118306

## 2024-05-23 LAB — POCT I-STAT, CHEM 8
BUN: 45 mg/dL — ABNORMAL HIGH (ref 6–20)
Calcium, Ion: 1.2 mmol/L (ref 1.15–1.40)
Chloride: 103 mmol/L (ref 98–111)
Creatinine, Ser: 9.1 mg/dL — ABNORMAL HIGH (ref 0.61–1.24)
Glucose, Bld: 211 mg/dL — ABNORMAL HIGH (ref 70–99)
HCT: 34 % — ABNORMAL LOW (ref 39.0–52.0)
Hemoglobin: 11.6 g/dL — ABNORMAL LOW (ref 13.0–17.0)
Potassium: 4.5 mmol/L (ref 3.5–5.1)
Sodium: 136 mmol/L (ref 135–145)
TCO2: 24 mmol/L (ref 22–32)

## 2024-05-23 MED ORDER — HEPARIN SODIUM (PORCINE) 1000 UNIT/ML IJ SOLN
INTRAMUSCULAR | Status: AC
  Filled 2024-05-23: qty 10

## 2024-05-23 MED ORDER — LIDOCAINE HCL (PF) 1 % IJ SOLN
INTRAMUSCULAR | Status: DC | PRN
  Administered 2024-05-23 (×2): 5 mL via SUBCUTANEOUS

## 2024-05-23 MED ORDER — HEPARIN SODIUM (PORCINE) 1000 UNIT/ML IJ SOLN
INTRAMUSCULAR | Status: DC | PRN
  Administered 2024-05-23: 2000 [IU] via INTRAVENOUS
  Administered 2024-05-23: 7000 [IU] via INTRAVENOUS

## 2024-05-23 MED ORDER — IODIXANOL 320 MG/ML IV SOLN
INTRAVENOUS | Status: DC | PRN
  Administered 2024-05-23: 65 mL via INTRAVENOUS

## 2024-05-23 MED ORDER — LIDOCAINE HCL (PF) 1 % IJ SOLN
INTRAMUSCULAR | Status: AC
  Filled 2024-05-23: qty 30

## 2024-05-23 MED ORDER — HEPARIN (PORCINE) IN NACL 1000-0.9 UT/500ML-% IV SOLN
INTRAVENOUS | Status: DC | PRN
  Administered 2024-05-23: 500 mL

## 2024-05-23 NOTE — Op Note (Signed)
    Patient name: Jesse Nguyen MRN: [id number] DOB: September 06, 1971 Sex: male  05/23/2024 Pre-operative Diagnosis: Occluded left forearm loop graft with ESRD Post-operative diagnosis:  Same Surgeon:  Young Hensen, MD Procedure Performed: 1.  Ultrasound-guided access left forearm loop graft with both arterial and venous working sheaths 2.  Left arm fistulogram 3.  Percutaneous thrombectomy with declot of left forearm loop graft (7 French straight aspiration catheter and over the wire fogarty)  4.  Balloon angioplasty left forearm loop graft including venous anastomosis with a 6 mm and 7 mm Mustang  Indications: 53 year old male using a left forearm loop graft.  He presents for declot after risks benefits discussed.  This was noted to be thrombosed in dialysis today.  Findings:   The left forearm loop graft was occluded.  Initially accessed toward the venous anastomosis with a 7 French sheath.  I used a 7 [place] aspiration catheter and made a total of 2 passes and then I was a get my wire through the venous anastomosis where there was a high-grade stenosis over 90%.  This was treated with a balloon angioplasty using a 6 and 7 mm Mustang.  I then got access toward the arterial anastomosis with a 6 French sheath and used 5 [place] over the Fogarty balloon.  Much better thrill.  The entire graft was treated with 7 mm angioplasty balloon at completion.   Procedure:  The patient was identified in the holding area and taken to [facility] lab.  The patient was then placed supine on the table and prepped and draped in the usual sterile fashion.  A time out was called.  Ultimately the left arm AV graft was accessed with an 18-gauge needle under ultrasound guidance toward the venous anastomosis after we injected lidocaine  in the skin.  I then used a J-wire and upsized to a 7 [place] sheath.  The patient was initially given 7000 units of IV heparin .  I then used a 7 [place] aspiration catheter with a 20 cc syringe  and aspirated throughout the graft including to the venous anastomosis where there was thrombus evident on fistulogram.  I then got a Glidewire across the venous anastomosis and then treated this with a 6 mm and then 7 mm Mustang to nominal pressure for 2 minutes.  I also treated the brachial vein outflow of the graft with balloon angioplasty.  I gave another 2,000 units IV heparin .    I then accessed the graft toward the arterial anastomosis with a 6 French sheath after accessing this with an 18-gauge needle under ultrasound guidance.  I then passed a Glidewire across the arterial anastomosis and the used over-the-wire Fogarty for mechanical thrombectomy and made two passes.  We had much better thrill.  I then shot a fistulogram and there was thrombus toward the venous anastomosis so I upsized and treated this with a 7 mm balloon and passed the aspiration catheter again.  Satisfied the results the sheaths were removed tying down pursestring suture.  Good thrill at completion.      Young Hensen, MD [facility] Office: [phone]

## 2024-05-23 NOTE — H&P (Signed)
 H&P    History of Present Illness: This is a 53 y.o. male with history of end-stage renal disease that presents for left arm declot of his forearm loop graft.  States this last worked on Tuesday.  He went to dialysis today and states they could not hear a bruit.  Past Medical History:  Diagnosis Date   CKD (chronic kidney disease), stage III (HCC)    Diabetes mellitus without complication (HCC)    Hepatitis C    HLD (hyperlipidemia)    Hypertension    Psoriasis     Past Surgical History:  Procedure Laterality Date   AV FISTULA PLACEMENT Left 11/22/2022   Procedure: LEFT ARM GRAFT CREATION;  Surgeon: Mayo Speck, MD;  Location: [facility];  Service: Vascular;  Laterality: Left;   EYE SURGERY     INCISION AND DRAINAGE BURSA / SUB-FASCIA OF FOOT Right 03/02/2022   INTRAOPERATIVE ARTERIOGRAM Left 01/03/2022   TRANSMETATARSAL AMPUTATION Left 01/04/2022    Allergies  Allergen Reactions   Other Rash    Fragrance soaps,perfumes    Prior to Admission medications   Medication Sig Start Date End Date Taking? Authorizing Provider  amLODipine  (NORVASC ) 5 MG tablet Take 1 tablet (5 mg total) by mouth daily. Patient taking differently: Take 10 mg by mouth daily. 04/21/20 02/28/24  Walisiewicz, Kaitlyn E, PA-C  apixaban (ELIQUIS) 5 MG TABS tablet Take 5 mg by mouth 2 (two) times daily.    [provider]  Aspirin-Acetaminophen -Caffeine (GOODY HEADACHE PO) Take 1-2 packets by mouth daily as needed (pain).    [provider]  bismuth subsalicylate (PEPTO BISMOL) 262 MG chewable tablet Chew 524 mg by mouth 3 (three) times a week.    [provider]  gabapentin (NEURONTIN) 300 MG capsule Take 300 mg by mouth Every Tuesday,Thursday,and Saturday with dialysis. 06/22/22   [provider]    Social History   Socioeconomic History   Marital status: Single    Spouse name: Not on file   Number of children: Not on file   Years of education: Not on file   Highest  education level: Not on file  Occupational History   Not on file  Tobacco Use   Smoking status: Every Day    Current packs/day: 0.15    Types: Cigarettes   Smokeless tobacco: Never  Vaping Use   Vaping status: Never Used  Substance and Sexual Activity   Alcohol use: No   Drug use: Not Currently    Frequency: 7.0 times per week    Types: Marijuana   Sexual activity: Yes  Other Topics Concern   Not on file  Social History Narrative   Not on file   Social Drivers of Health   Financial Resource Strain: Low Risk  (06/09/2021)   Received from [facility]   Overall Financial Resource Strain (CARDIA)    Difficulty of Paying Living Expenses: Not hard at all  Food Insecurity: Medium Risk (10/01/2023)   Received from [facility]   Hunger Vital Sign    Worried About Running Out of Food in the Last Year: Sometimes true    Ran Out of Food in the Last Year: Sometimes true  Transportation Needs: Unmet Transportation Needs (10/01/2023)   Received from [facility]    In the past 12 months, has lack of reliable transportation kept you from medical appointments, meetings, work or from getting things needed for daily living? : Yes  Physical Activity: Not on  file  Stress: Not on file  Social Connections: Not on file  Intimate Partner Violence: Low Risk  (02/08/2023)   Received from [facility] visits prior to 02/25/2023.   Safety    How often does anyone, including family and friends, physically hurt you?: Never    How often does anyone, including family and friends, insult or talk down to you?: Never    How often does anyone, including family and friends, threaten you with harm?: Never    How often does anyone, including family and friends, scream or curse at you?: Never     Family History  Problem Relation Age of Onset   Diabetes Father    Diabetes Sister     ROS: [x]  Positive   [ ]  Negative   [ ]  All sytems reviewed and are  negative  Cardiovascular: []  chest pain/pressure []  palpitations []  SOB lying flat []  DOE []  pain in legs while walking []  pain in legs at rest []  pain in legs at night []  non-healing ulcers []  hx of DVT []  swelling in legs  Pulmonary: []  productive cough []  asthma/wheezing []  home O2  Neurologic: []  weakness in []  arms []  legs []  numbness in []  arms []  legs []  hx of CVA []  mini stroke [] difficulty speaking or slurred speech []  temporary loss of vision in one eye []  dizziness  Hematologic: []  hx of cancer []  bleeding problems []  problems with blood clotting easily  Endocrine:   []  diabetes []  thyroid disease  GI []  vomiting blood []  blood in stool  GU: []  CKD/renal failure []  HD--[]  M/W/F or []  T/T/S []  burning with urination []  blood in urine  Psychiatric: []  anxiety []  depression  Musculoskeletal: []  arthritis []  joint pain  Integumentary: []  rashes []  ulcers  Constitutional: []  fever []  chills   Physical Examination  Vitals:   05/23/24 1232  BP: (!) 153/82  Pulse: 61  Resp: 14  Temp: 97.9 F (36.6 C)  SpO2: 100%   There is no height or weight on file to calculate BMI.  General:  WDWN in NAD Gait: Not observed HENT: WNL, normocephalic Pulmonary: normal non-labored breathing Cardiac: regular, without  Murmurs, rubs or gallops Abdomen:  soft, NT/ND Vascular Exam/Pulses: Left forearm graft without thrill Left radial pulse palpable  CBC    Component Value Date/Time   WBC 12.1 (H) 04/21/2020 1357   RBC 4.07 (L) 04/21/2020 1357   HGB 11.4 (L) 11/22/2022 0626   HCT 36.7 (L) 11/22/2022 0626   PLT 299 04/21/2020 1357   MCV 91.9 04/21/2020 1357   MCH 30.7 04/21/2020 1357   MCHC 33.4 04/21/2020 1357   RDW 13.3 04/21/2020 1357   LYMPHSABS 2.6 04/21/2020 1357   MONOABS 0.8 04/21/2020 1357   EOSABS 0.1 04/21/2020 1357   BASOSABS 0.1 04/21/2020 1357    BMET    Component Value Date/Time   NA 137 11/22/2022 0626   K 4.1  11/22/2022 0626   CL 99 11/22/2022 0626   CO2 25 11/22/2022 0626   GLUCOSE 213 (H) 11/22/2022 0626   BUN 40 (H) 11/22/2022 0626   CREATININE 6.24 (H) 11/22/2022 0626   CALCIUM 9.0 11/22/2022 0626   GFRNONAA 10 (L) 11/22/2022 0626   GFRAA >60 04/21/2020 1357    COAGS: No results found for: "INR", "PROTIME"   Non-Invasive Vascular Imaging:    N/A   ASSESSMENT/PLAN: This is a 53 y.o. male with history of end-stage renal disease that presents for left arm declot of  his forearm loop graft.  States this last worked on Tuesday.  He went to dialysis today and states they could not hear a bruit.  Discussed plan for left arm fistulogram with declot and if no success will require catheter.  Young Hensen, MD [facility] [place] Office: [phone]  Young Hensen

## 2024-05-24 ENCOUNTER — Encounter (HOSPITAL_COMMUNITY): Payer: Self-pay | Admitting: Vascular Surgery

## 2024-07-23 NOTE — H&P (View-Only) (Signed)
 [facility] Vascular Surgery Clinic Note    Subjective:  HPI:  Jesse Nguyen is a 53 y.o. male who presents to clinic for routine surveillance of his peripheral vascular disease.  Patient has known chronic conditions which include: DM (A1c 7.1%), neuropathy, hypertension, PAD hx RBKA, HD ESRD, HLD, depression, and tobacco abuse.  Denies any major changes to his health status. Reports he has noticed a wound on his left TMA that he thinks is from his orthotic. Denies fevers or chills. States the wound has been present for some time. Remains compliant with Aspirin, Eliquis, and Rosuvastatin.      The following portions of the patient's history were reviewed and updated as appropriate: allergies, current medications, past family history, past medical history, past social history, past surgical history, and problem list.  Vitals:   07/22/24 0916  BP: 139/65  BP Location: Right arm  Patient Position: Sitting  Pulse: 72  Resp: 18  Temp: 98 F (36.7 C)  TempSrc: Temporal  SpO2: 100%  Weight: 93 kg (205 lb)    Medications Ordered Prior to Encounter[1]   Review of Systems  All other systems reviewed and are negative.    Objective: Physical Exam HENT:     Head: Normocephalic and atraumatic.   Cardiovascular:     Rate and Rhythm: Normal rate.     Pulses:          Femoral pulses are 2+ on the right side and 2+ on the left side.      Dorsalis pedis pulses are detected w/ Doppler on the left side.       Posterior tibial pulses are detected w/ Doppler on the left side.  Pulmonary:     Effort: Pulmonary effort is normal.   Musculoskeletal:        General: Normal range of motion.     Cervical back: Normal range of motion.     Comments: Right BKA with prosthetic limb  Feet:     Comments: Left foot old TMA with nonhealing wound on plantar surface at first metatarsal. Np surrounding erythema or warmth.   Skin:    General: Skin is warm and dry.     Capillary  Refill: Capillary refill takes less than 2 seconds.   Neurological:     Mental Status: He is alert.                Assessment: Peripheral artery disease with nonhealing wound to left TMA  ABIs L 0.5. Discussed with Evalene Francis Pouch, MD      Diabetes Mellitus  with vascular complications: Most recent A1c 7.1%.   A1c lab was below system-wide quality goal < 8%.  Discussed correlation of arterial disease and diabetes and importance of glucose management.    Plan:  -Case request for arteriogram for left lower extremity  -Hold Eliquis 2 days prior to procedure -NPO after midnight prior to surgery -Aquacel to wound bed, instructed to change every other day -Ambulatory referral to wound clinic -Call the clinic with any questions or concerns    I have personally spent 30 minutes involved in face-to-face and non-face-to-face activities for this patient on the day of the visit.  Patient care activities included preparing to see the patient such as reviewing the patient record, obtaining and/or reviewing separately obtained history, performing a medically appropriate history and physical examination, counseling and educating the patient, family, and/or caregiver, ordering prescription medications, tests, or procedures, referring and communicating with other health care providers  when not separately reported during the visit, documenting clinical information in the electronic or other health record, independently interpreting results when not separately reported, communicating results to the patient/family/caregiver and coordinating the care of the patient when not separately reported.         [1] Current Outpatient Medications on File Prior to Visit  Medication Sig Dispense Refill  . amLODIPine  (NORVASC ) 10 mg tablet Take 10 mg by mouth every evening.    SABRA apixaban (ELIQUIS) 5 mg tab Take 5 mg by mouth 2 (two) times a day.    SABRA aspirin 81 mg EC tablet Take 81 mg by mouth  daily.    . carvediloL (COREG) 6.25 mg tablet HOLD UNTIL PCP FOLLOW UP. Prior dose: Take 1 tablet (6.25mg ) by mouth 2 times daily with meals.    . gabapentin  (NEURONTIN ) 300 mg capsule Take 1 capsule (300 mg total) by mouth nightly as needed (neuropathic pain). 90 capsule 3  . hydrALAZINE  (APRESOLINE ) 50 mg tablet HOLD UNTIL PCP FOLLOW UP. Prior Dose: Take 1 tablet (50 mg total) by mouth 3 (three) times a day. 270 tablet 0  . omeprazole (PriLOSEC) 20 mg DR capsule Take 20 mg by mouth Once Daily. 90 capsule 3  . polysaccharide iron complex (NU-IRON) 150 mg iron capsule HOLD until PCP Followup. Prior Dose: Take 1 capsule (150 mg total) by mouth every other day.    . rosuvastatin (CRESTOR) 10 mg tablet HOLD until PCP follow up. Prior dose: Take 1 tablet (10 mg total) by mouth every evening.    . sevelamer carbonate (Renvela) 800 mg tablet Take 2 tablets (1,600 mg total) by mouth in the morning and 2 tablets (1,600 mg total) at noon and 2 tablets (1,600 mg total) in the evening. Take with meals. Swallow tablet whole; do not crush, break, or chew.. 180 tablet 0  . SITagliptin  phosphate (Januvia ) 25 mg tablet Take 25 mg by mouth Once Daily. 90 tablet 3  . cyanocobalamin (VITAMIN B12) 1,000 mcg tablet Take 1,000 mcg by mouth Once Daily. (Patient not taking: Reported on 07/22/2024) 30 tablet 0   Current Facility-Administered Medications on File Prior to Visit  Medication Dose Route Frequency Provider Last Rate Last Admin  . lidocaine  (XYLOCAINE ) 2 % jelly   topical PRN Jamee Kaska, DO      . sodium hypochlorite (ANASEPT SKIN-WOUND CLEANSER) 0.057 % irrigation   irrigation PRN Jamee Kaska, DO

## 2024-08-06 NOTE — Interval H&P Note (Signed)
 The patient's surgical history has been reviewed and remains accurate without interval change.  The patient was re-examined and patient's physiologic condition has not changed significantly since prior evaluation. The condition still exists that makes this procedure necessary. The treatment plan remains the same, without new options for care.  No new pharmacological allergies or types of therapy has been initiated that would change the plan or the appropriateness of the plan.  The patient and/or family understand the potential benefits and risks.  OR today for LLE angio. Held Eliquis.   I saw and evaluated the patient.  I agree with the above resident assessment and plan of care.  Evalene MARLA Pouch, MD   Tue 08/06/2024 4:49 PM

## 2024-08-06 NOTE — Telephone Encounter (Signed)
 Patient is s/p LLE angio with SFA stent and AT PTA with Dr. Trudy 8/12. Please schedule for follow up with APP with duplex in 1 month thank you.

## 2024-10-09 NOTE — Progress Notes (Signed)
 Subjective  Jesse Nguyen is a 53 y.o. male who presents for No chief complaint on file. 08/06/2024 procedure: - Balloon angioplasty of left SFA with 6 mm Mustang balloon  - Stenting of left SFA with 6mm Innova stent - Balloon angioplasty of left AT with 2 mm Coyote balloon   History of Present Illness The patient is an individual who presents for postoperative follow-up related to chronic limb-threatening ischemia (CLTI) with tissue loss in the left lower extremity. They underwent angioplasty and stenting of their left superficial femoral artery (SFA) and angioplasty of the left anterior tibial (AT) artery. They are also currently followed by wound care and have an appointment this afternoon.  They report that their wound was debrided last Friday at the wound care center, and they were advised to return today. They were informed yesterday that the wound needs to be removed. Their podiatrist has advised them to wait for the swelling to subside. They recall a period of six months where they were able to manage their condition without any odor or infection.  They live independently and manage their food intake without difficulty. They use public transportation for their dialysis appointments but require assistance to get off their porch. They have noticed an improvement in their strength over the past three months.  They are currently on end-stage renal dialysis, receiving treatment via a left radiocephalic fistula on Tuesdays, Thursdays, and Saturdays.  PAST SURGICAL HISTORY: Angioplasty and stenting of the left superficial femoral artery (SFA) Angioplasty of the left anterior tibial (AT) artery  SOCIAL HISTORY Tobacco: Smokes 2 cigarettes a day.  Review of Systems  All other systems reviewed and are negative.   Objective  Blood pressure (!) 201/76, pulse 66, temperature 97.8 F (36.6 C), temperature source Temporal, resp. rate 18, height 1.829 m (6'), weight 93 kg (205 lb), SpO2  100%. Physical Exam General Appearance: No acute distress, not ill appearing and speaks in full sentences Vital signs: Within normal limits HEENT: Normocephalic, atruamtic, moist mucous membranes, normal conjunctivae, PERRLA Respiratory: no shortness of breath or labored breathing Cardiovascular: regular rate and rhythm Extremities: Left lower extremity anterior tibial and posterior tibial signals are intact Skin: Warm/dry, no rash or wounds noted Neurological: Normal Psychiatric: Appropriate mood and affect. Good judgement and insight  Results - Imaging:   - Left lower extremity: Significant narrowing before the stent with a velocity of 650   - ABI: Normal at 0.96    Assessment & Plan 1. Critical limb ischemia, left lower extremity: Chronic. ABI 0.96 in the left leg. Imaging results from 10/09/2024 show significant narrowing before the stent, with a velocity of 650. Proximal left SFA stent stenosis in the context of nonhealing wounds on the left foot. - Plan for an arteriogram on 10/29/2024. - Access the right groin in the femoral artery, advance the wire, and use a balloon to open up the narrowed area. - Coordinate dialysis schedule with the center due to the procedure being scheduled on a Tuesday. - Discontinue Eliquis three days prior to the procedure, with the last dose on 10/26/2024. - Signed consent form for surgery with Dr. Trudy on 10/29/2024.  2. End-stage renal disease: Chronic. - Undergoing end-stage renal dialysis via a left radiocephalic fistula on Tuesdays, Thursdays, and Saturdays. - Coordinate dialysis schedule with the center due to the procedure being scheduled on a Tuesday.  3. Wound care: Chronic. - Followed by wound care with an appointment this afternoon. - Continue with wound care as scheduled.  Follow-up - Arteriogram  scheduled for 10/29/2024.

## 2024-11-08 NOTE — Progress Notes (Signed)
 Debridement Anterior;Left;Lateral Foot  Performed by: Patriciaann Aloha Carmel, MD Authorized by: Patriciaann Aloha Carmel, MD  Associated wounds:  Wound 09/27/24 Foot Anterior;Left;Lateral  Debridement Type:  Debridement Goals of Debridement:  Remove devitalized tissue, Prevent further complications, Decrease risk of infection and Promote wound healing Severity of Tissue Pre-Debridement:  Fat Layer Exposed Level of Consciousness Pre-Procedure:  Awake and Alert Pre-Procedure Verification/Time-Out Taken:  Yes Start Time:  11/08/2024 1:42 PM Pain Control:  Lidocaine  2% Topical Gel Tissue and Other Material Debrided:  Non-Viable, Biofilm, Subcutaneous and Slough Excisional Method:  Excisional Level:  Subcutaneous Tissue Instrument:  Curette Specimen:  None Bleeding:  Minimum Hemostasis Achieved:  Pressure End Time:  11/08/2024 1:46 PM Procedural Pain:  0 Post Procedural Pain:  0 Response to Treatment:  Procedure was Tolerated Well Level of Consciousness Post-Procedure:  Awake and Alert Pre-debridement measurements:    Time taken:  11/08/2024 1:35 PM   Length (cm):  1.5   Width (cm):  2   Depth (cm):  0.4 Post-debridement measurements:    Time taken:  11/08/2024 1:35 PM   Length (cm):  1.6   Width (cm):  2.1   Depth (cm):  0.7 Total Area Debrided:    Length (cm):  1.6   Width (cm):  2.1   Surface Area (cm2):  3.36 Character of Wound / Ulcer Post Debridement:  Stable Severity of Tissue Post Debridement:  Fat Layer Exposed Post Procedure Diagnosis:  Same as Pre-Procedure Wound Dressing:  Collagen and Foam

## 2024-11-08 NOTE — Progress Notes (Signed)
 ------------------------------------------------------------------------------- Attestation signed by Patriciaann Aloha Carmel, MD at 11/08/2024  2:18 PM I performed the service or was physically present during the critical or key portions of the service furnished by me; and he/she managed the patient.  Patriciaann Aloha Carmel, MD 11/08/2024 -------------------------------------------------------------------------------   WOUND CARE CONSULTATION REPORT - OUTPATIENT CLINIC  [facility]  Referred by: Levy Vinie Lin, PA*  Chief Complaint: No chief complaint on file.   Relevant past medical history includes: Patient Active Problem List   Diagnosis Date Noted   . Critical limb ischemia of left lower extremity (CMD) 10/09/2024  . Callus of foot 10/09/2024  . Type 2 diabetes mellitus with foot ulcer (CODE) 10/09/2024  . PAD (peripheral artery disease) 07/22/2024  . ESRD (end stage renal disease) on dialysis    (CMD) 04/15/2024  . Wound infection 10/02/2023  . Osteomyelitis 10/01/2023  . Nonhealing amputation stump    (CMD) 04/26/2023  . Infection of right foot 02/07/2023  . Acute venous embolism and thrombosis of deep vessels of proximal lower extremity (HCC) 10/04/2022  . Critical limb ischemia of both lower extremities (HCC) 06/13/2022  . CKD (chronic kidney disease) stage 4, GFR 15-29 ml/min (HCC) 05/20/2022  . Anemia in stage 4 chronic kidney disease (HCC) 05/20/2022  . Metabolic acidosis 05/20/2022  . Hypoalbuminemia 05/20/2022  . Ulcer of right lower extremity with fat layer exposed (HCC) 02/21/2022    Overview Note:    Added automatically from request for surgery [id number]    . Peripheral vascular disease (HCC) 02/21/2022    Overview Note:    Added automatically from request for surgery [id number]    . Acute kidney injury (HCC) 01/10/2022    Overview Note:    Last Assessment & Plan:  Formatting of this note might be  different from the original. Secondary to Bactrim  DS (IMPROVING) Continue oral hydration 64 oz daily Formatting of this note might be different from the original. Last Assessment & Plan:  Formatting of this note might be different from the original. Secondary to Bactrim  DS (IMPROVING) Continue oral hydration 64 oz daily    . Diabetic neuropathy (HCC) 01/10/2022  . Hyperlipidemia 01/10/2022  . Major depressive disorder with single episode 01/10/2022  . Primary hypertension 06/08/2021  . Tobacco user 06/08/2021  . Type 2 diabetes mellitus with hyperglycemia (HCC) 06/08/2021  . Ulcer of foot due to type 2 diabetes mellitus    (CMD) 06/08/2021    Resolved Problems   Diagnosis Date Noted Date Resolved  . Osteomyelitis 03/30/2023 04/03/2023   Medical History[1]  Relevant work up/results:  Perfusion study: Patient with known perfusion deficits and status post vascularization Last arterial study September 11, 2024: LEFT  Left ankle-brachial index of 0.78 has improved notably post-intervention  previous 0.50 . Study suggests non-critical stenosis of the common femoral artery near the bifurcation; area is poorly visualized due to calcific shadowing. Widely patent left mid superficial femoral artery to proximal popliteal artery stent with no evidence of significant stenosis.   Relevant Labs: Lab Results  Component Value Date   HGBA1C 7.1 (H) 04/26/2023   Lab Results  Component Value Date   HGBA1C 7.1 (H) 04/26/2023   HGBA1C 8.5 (H) 02/01/2023   HGBA1C  02/01/2023             HGBA1C 6.3 (H) 09/20/2022   HGBA1C  09/20/2022             ALBUMIN 3.6 08/06/2024   ALBUMIN 3.4 (L) 10/01/2023  ALBUMIN 3.7 09/30/2023   ALBUMIN 2.8 (L) 04/26/2023   BNP 130 (H) 02/09/2023   BNP 534 (H) 09/20/2022   BNP 180 (H) 03/19/2022   BNP 282 (H) 12/30/2021   PLT 261 10/04/2023   PLT 283 10/03/2023   PLT 276 10/02/2023   PLT 267 10/01/2023   WBC 10.60 10/04/2023   WBC 8.70 10/03/2023    WBC 8.40 10/02/2023   WBC 8.10 10/01/2023     Relevant imaging studies:  Initial visit /Background / obtained on 09/27/24 :  Unfortunately 53 years old known to us  from multiple previous episodes of care last time seen in 2024. 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 Patient reports this was caused by this orthotic shoes He is now ambulating regular shoes.  Interval history/progress:  10/09/2024: Unfortunately, pt got his foot wet on Saturday and kept the cast on. He reports good protein intake and is trying to gain weight for the gym. We discussed the importance of high protein intake without the goal of weight gain or excess carb intake. Pt continues to bear weight on foot.   10/18/2024: Doing well with total contact cast improving very well. He is scheduled for angiogram November 4  10/25/2024: Doing well with soft cast continues to improved.  Significant contraction noticed today  11/01/2024: Continues to have contraction.  Still has some depth.  11/08/2024: Progressing slow. Appears to have same depth.    Vitals:   11/08/24 1334  BP: (!) 192/75  Pulse: 66  Temp:     Exam: Pt is AA in NAD. Oriented x 3 Wound(s) exam: Left forefoot with 2 ulcerations - distal first MT appears healed Lateral fifth MT with major contraction noticed.  The still a centimeter depth completely excised to healthy granular tissue.  Wound 09/27/24 Foot Anterior;Left;Lateral (Active)  Date First Assessed/Time First Assessed: 09/27/24 0913   Location: Foot  Wound Location Orientation: Anterior;Left;Lateral    Assessments 09/27/2024  9:16 AM 11/08/2024  1:35 PM  Wound Image     Site Assessment Dry;Intact Pale  Peri-Wound Assessment -- Callus  Edges -- Irregular  Drainage Amount None Moderate  Drainage Description --  Serosanguineous  Wound Odor None None  Treatments -- Cleansed  Wound Length (cm) 0.1 cm 1.5 cm  Wound Width (cm) 0.1 cm 2 cm  Wound Depth (cm) 0.1 cm 0.4 cm  Wound Volume (cm^3) 0.001 cm^3 0.628 cm^3  Wound Healing % -- -62700  Non-staged Wound Description Not applicable Full thickness  State of Healing -- Stable     Active Orders  Date Order Priority Status Authorizing Provider  11/08/24 1346 Debridement Anterior;Left;Lateral Foot Routine Active Patriciaann Aloha Carmel, MD  11/01/24 1447 Wound Care Anterior;Left;Lateral Foot Routine Active Patriciaann Aloha Carmel, MD    - Wound cleansing::    Cleanse wound with wound cleanser    - Peri wound/skin care::    Apply lotion/moisturizer to peri wound and extremity    - Dressing change frequency::    Do not change dressing for an entire week    - Primary wound dressing::    Prisma    - Secondary wound dressing::    Foam    - Off-loading::    Other (Specify) (soft cast)    - Additional Orders/Instructions::    Follow nutritous diet    - Follow Up Appointment::    Return to clinic  in 1 week  10/18/24 1327 Wound Care Anterior;Left;Lateral Foot Routine Active Patriciaann Aloha Carmel, MD    - Wound cleansing::    Other (Specify) (soap and water)    - Primary wound dressing::    Other (Specify) (collagen, xeroform)    - Secondary wound dressing::    Other (Specify) (mextra to lateral wound)    - Additional Orders/Instructions::    Follow nutritous diet    - Follow Up Appointment::    Return to clinic in 1 week  10/09/24 1223 Wound Care Routine Active Patriciaann Aloha Carmel, MD    - Wound cleansing::    Cleanse wound with wound cleanser    - Dressing change frequency::    Do not change dressing for an entire week    - Primary wound dressing::    Prisma (xeroform)    - Primary wound dressing::    Other (Specify)    - Secondary wound dressing::    Foam    - Off-loading::    Other (Specify) (soft cast)    - Additional Orders/Instructions::    Follow nutritous  diet    - Follow Up Appointment::    Return to clinic in 1 week  09/27/24 1204 Wound Care Anterior;Left;Lateral Foot Routine Active Patriciaann Aloha Carmel, MD    - Wound cleansing::    Cleanse wound with wound cleanser    - Dressing change frequency::    Do not change dressing for an entire week    - Primary wound dressing::    Prisma    - Secondary wound dressing::    Foam    - Off-loading::    Other (Specify) (soft cast)    - Additional Orders/Instructions::    Follow nutritous diet    - Follow Up Appointment::    Return to clinic in 1 week     Inactive Orders  Date Order Priority Status Authorizing Provider  11/01/24 1317 Debridement Anterior;Left;Lateral Foot Routine Completed Patriciaann Aloha Carmel, MD  10/25/24 1323 Debridement Anterior;Left;Lateral Foot Routine Completed Patriciaann Aloha Carmel, MD  10/18/24 1318 Debridement Anterior;Left;Lateral Foot Routine Completed Patriciaann Aloha Carmel, MD  10/09/24 1131 Debridement Anterior;Left;Lateral Foot Routine Completed Patriciaann Aloha Carmel, MD  09/27/24 0930 Debridement Anterior;Left;Lateral Foot Routine Completed Patriciaann Aloha Carmel, MD    Assessment Encounter Diagnoses  Name Primary?  . Type 2 diabetes mellitus with foot ulcer (CODE) Yes     PLAN: Complete excisional debridement was done today with hemostasis.  Packed with collagen and used Mextra Superabsorbent's dressings Will continue with offloading with soft cast.  Protein goal 100 g a day Scheduled for angiogram 11/18 with vascular surgery   Orders Placed This Encounter  Procedures  . Debridement Anterior;Left;Lateral Foot    This order was created via procedure documentation    F/u in [facility] in 1 week  Jesse Nguyen 11/08/24  Electronically signed by:  Jesse Nguyen, DPM 11/08/2024 2:05 PM          [1] Past Medical History: Diagnosis Date  . Acute cough   . CKD (chronic kidney disease) stage 3, GFR 30-59 ml/min (CMD)   . HLD (hyperlipidemia)    . Hypertension   . Right leg swelling   . Shortness of breath   . Type 2 diabetes mellitus   . Unspecified open wound, right foot, sequela

## 2024-11-18 ENCOUNTER — Encounter (HOSPITAL_COMMUNITY): Payer: Self-pay

## 2024-11-18 ENCOUNTER — Other Ambulatory Visit: Payer: Self-pay

## 2024-11-18 ENCOUNTER — Emergency Department (HOSPITAL_COMMUNITY)

## 2024-11-18 ENCOUNTER — Observation Stay (HOSPITAL_COMMUNITY)
Admission: EM | Admit: 2024-11-18 | Discharge: 2024-11-20 | Disposition: A | Discharge status: Home / Self Care | Source: Ambulatory Visit | Attending: Family Medicine | Admitting: Family Medicine

## 2024-11-18 DIAGNOSIS — D62 Acute posthemorrhagic anemia: Secondary | ICD-10-CM | POA: Diagnosis not present

## 2024-11-18 DIAGNOSIS — I739 Peripheral vascular disease, unspecified: Secondary | ICD-10-CM | POA: Diagnosis not present

## 2024-11-18 DIAGNOSIS — E211 Secondary hyperparathyroidism, not elsewhere classified: Secondary | ICD-10-CM | POA: Diagnosis not present

## 2024-11-18 DIAGNOSIS — K449 Diaphragmatic hernia without obstruction or gangrene: Secondary | ICD-10-CM | POA: Diagnosis not present

## 2024-11-18 DIAGNOSIS — Z95820 Peripheral vascular angioplasty status with implants and grafts: Secondary | ICD-10-CM | POA: Diagnosis not present

## 2024-11-18 DIAGNOSIS — I120 Hypertensive chronic kidney disease with stage 5 chronic kidney disease or end stage renal disease: Secondary | ICD-10-CM | POA: Diagnosis not present

## 2024-11-18 DIAGNOSIS — Z79899 Other long term (current) drug therapy: Secondary | ICD-10-CM | POA: Diagnosis not present

## 2024-11-18 DIAGNOSIS — Z992 Dependence on renal dialysis: Secondary | ICD-10-CM | POA: Diagnosis not present

## 2024-11-18 DIAGNOSIS — D649 Anemia, unspecified: Principal | ICD-10-CM | POA: Diagnosis present

## 2024-11-18 DIAGNOSIS — R933 Abnormal findings on diagnostic imaging of other parts of digestive tract: Secondary | ICD-10-CM | POA: Diagnosis not present

## 2024-11-18 DIAGNOSIS — I82409 Acute embolism and thrombosis of unspecified deep veins of unspecified lower extremity: Secondary | ICD-10-CM | POA: Diagnosis present

## 2024-11-18 DIAGNOSIS — K921 Melena: Secondary | ICD-10-CM

## 2024-11-18 DIAGNOSIS — Z86718 Personal history of other venous thrombosis and embolism: Secondary | ICD-10-CM | POA: Diagnosis not present

## 2024-11-18 DIAGNOSIS — K2970 Gastritis, unspecified, without bleeding: Secondary | ICD-10-CM | POA: Diagnosis not present

## 2024-11-18 DIAGNOSIS — D509 Iron deficiency anemia, unspecified: Secondary | ICD-10-CM | POA: Diagnosis not present

## 2024-11-18 DIAGNOSIS — E1122 Type 2 diabetes mellitus with diabetic chronic kidney disease: Secondary | ICD-10-CM | POA: Diagnosis not present

## 2024-11-18 DIAGNOSIS — Q103 Other congenital malformations of eyelid: Secondary | ICD-10-CM

## 2024-11-18 DIAGNOSIS — H02826 Cysts of left eye, unspecified eyelid: Secondary | ICD-10-CM | POA: Diagnosis not present

## 2024-11-18 DIAGNOSIS — N186 End stage renal disease: Secondary | ICD-10-CM | POA: Diagnosis not present

## 2024-11-18 DIAGNOSIS — K259 Gastric ulcer, unspecified as acute or chronic, without hemorrhage or perforation: Secondary | ICD-10-CM | POA: Diagnosis not present

## 2024-11-18 DIAGNOSIS — D631 Anemia in chronic kidney disease: Secondary | ICD-10-CM | POA: Diagnosis not present

## 2024-11-18 DIAGNOSIS — F17210 Nicotine dependence, cigarettes, uncomplicated: Secondary | ICD-10-CM | POA: Diagnosis not present

## 2024-11-18 DIAGNOSIS — Z7901 Long term (current) use of anticoagulants: Secondary | ICD-10-CM | POA: Diagnosis not present

## 2024-11-18 DIAGNOSIS — Z7902 Long term (current) use of antithrombotics/antiplatelets: Secondary | ICD-10-CM | POA: Diagnosis not present

## 2024-11-18 HISTORY — DX: Gastro-esophageal reflux disease without esophagitis: K21.9

## 2024-11-18 HISTORY — DX: Dependence on renal dialysis: Z99.2

## 2024-11-18 HISTORY — DX: End stage renal disease: N18.6

## 2024-11-18 LAB — COMPREHENSIVE METABOLIC PANEL WITH GFR
ALT: 16 U/L (ref 0–44)
AST: 21 U/L (ref 15–41)
Albumin: 3.9 g/dL (ref 3.5–5.0)
Alkaline Phosphatase: 61 U/L (ref 38–126)
Anion gap: 14 (ref 5–15)
BUN: 43 mg/dL — ABNORMAL HIGH (ref 6–20)
CO2: 28 mmol/L (ref 22–32)
Calcium: 9.6 mg/dL (ref 8.9–10.3)
Chloride: 96 mmol/L — ABNORMAL LOW (ref 98–111)
Creatinine, Ser: 9.07 mg/dL — ABNORMAL HIGH (ref 0.61–1.24)
GFR, Estimated: 6 mL/min — ABNORMAL LOW (ref >60)
Glucose, Bld: 204 mg/dL — ABNORMAL HIGH (ref 70–99)
Potassium: 4 mmol/L (ref 3.5–5.1)
Sodium: 138 mmol/L (ref 135–145)
Total Bilirubin: 0.3 mg/dL (ref 0.0–1.2)
Total Protein: 7.3 g/dL (ref 6.5–8.1)

## 2024-11-18 LAB — CBC
HCT: 22.1 % — ABNORMAL LOW (ref 39.0–52.0)
Hemoglobin: 7.1 g/dL — ABNORMAL LOW (ref 13.0–17.0)
MCH: 33.8 pg (ref 26.0–34.0)
MCHC: 32.1 g/dL (ref 30.0–36.0)
MCV: 105.2 fL — ABNORMAL HIGH (ref 80.0–100.0)
Platelets: 260 K/uL (ref 150–400)
RBC: 2.1 MIL/uL — ABNORMAL LOW (ref 4.22–5.81)
RDW: 15.6 % — ABNORMAL HIGH (ref 11.5–15.5)
WBC: 9 K/uL (ref 4.0–10.5)
nRBC: 0 % (ref 0.0–0.2)

## 2024-11-18 LAB — HEMOGLOBIN AND HEMATOCRIT, BLOOD
HCT: 29.5 % — ABNORMAL LOW (ref 39.0–52.0)
Hemoglobin: 9.5 g/dL — ABNORMAL LOW (ref 13.0–17.0)

## 2024-11-18 LAB — MRSA NEXT GEN BY PCR, NASAL: MRSA by PCR Next Gen: NOT DETECTED

## 2024-11-18 LAB — PREPARE RBC (CROSSMATCH)

## 2024-11-18 LAB — HEPATITIS B SURFACE ANTIGEN: Hepatitis B Surface Ag: NONREACTIVE

## 2024-11-18 LAB — POC OCCULT BLOOD, ED: Fecal Occult Bld: POSITIVE — AB

## 2024-11-18 LAB — ABO/RH: ABO/RH(D): A POS

## 2024-11-18 MED ORDER — ANTICOAGULANT SODIUM CITRATE 4% (200MG/5ML) IV SOLN: 5.0000 mL | Status: DC | PRN

## 2024-11-18 MED ORDER — ACETAMINOPHEN 650 MG RE SUPP: 650.0000 mg | Freq: Four times a day (QID) | RECTAL | Status: DC | PRN

## 2024-11-18 MED ORDER — HYDRALAZINE HCL 20 MG/ML IJ SOLN
10.0000 mg | INTRAMUSCULAR | Status: DC | PRN
  Administered 2024-11-18: 10 mg via INTRAVENOUS

## 2024-11-18 MED ORDER — GABAPENTIN 300 MG PO CAPS: 300.0000 mg | ORAL_CAPSULE | ORAL | Status: DC

## 2024-11-18 MED ORDER — ACETAMINOPHEN 325 MG PO TABS
650.0000 mg | ORAL_TABLET | Freq: Four times a day (QID) | ORAL | Status: DC | PRN
Start: 2024-11-18 — End: 2024-11-20

## 2024-11-18 MED ORDER — HYDRALAZINE HCL 20 MG/ML IJ SOLN: 10.0000 mg | Freq: Once | INTRAMUSCULAR | Status: DC

## 2024-11-18 MED ORDER — ONDANSETRON HCL 4 MG PO TABS
4.0000 mg | ORAL_TABLET | Freq: Four times a day (QID) | ORAL | Status: DC | PRN
Start: 2024-11-18 — End: 2024-11-20

## 2024-11-18 MED ORDER — HEPARIN SODIUM (PORCINE) 1000 UNIT/ML DIALYSIS: 1000.0000 [IU] | INTRAMUSCULAR | Status: DC | PRN

## 2024-11-18 MED ORDER — GABAPENTIN 300 MG PO CAPS
300.0000 mg | ORAL_CAPSULE | Freq: Once | ORAL | Status: AC
  Administered 2024-11-18: 300 mg via ORAL
  Filled 2024-11-18: qty 1

## 2024-11-18 MED ORDER — LIDOCAINE HCL (PF) 1 % IJ SOLN: 5.0000 mL | INTRAMUSCULAR | Status: DC | PRN

## 2024-11-18 MED ORDER — CINACALCET HCL 30 MG PO TABS
60.0000 mg | ORAL_TABLET | ORAL | Status: DC
  Administered 2024-11-20: 60 mg via ORAL
  Filled 2024-11-18: qty 2

## 2024-11-18 MED ORDER — SODIUM CHLORIDE 0.9% IV SOLUTION: Freq: Once | INTRAVENOUS | Status: AC

## 2024-11-18 MED ORDER — PANTOPRAZOLE SODIUM 40 MG IV SOLR
40.0000 mg | Freq: Two times a day (BID) | INTRAVENOUS | Status: DC
  Administered 2024-11-18 – 2024-11-19 (×3): 40 mg via INTRAVENOUS
  Filled 2024-11-18 (×4): qty 10

## 2024-11-18 MED ORDER — CHLORHEXIDINE GLUCONATE CLOTH 2 % EX PADS
6.0000 | MEDICATED_PAD | Freq: Every day | CUTANEOUS | Status: DC
  Administered 2024-11-19 – 2024-11-20 (×2): 6 via TOPICAL

## 2024-11-18 MED ORDER — AMLODIPINE BESYLATE 5 MG PO TABS
10.0000 mg | ORAL_TABLET | Freq: Every day | ORAL | Status: DC
  Administered 2024-11-19 – 2024-11-20 (×2): 10 mg via ORAL
  Filled 2024-11-18 (×2): qty 2

## 2024-11-18 MED ORDER — DARBEPOETIN ALFA 100 MCG/0.5ML IJ SOSY
100.0000 ug | PREFILLED_SYRINGE | Freq: Once | INTRAMUSCULAR | Status: DC
  Filled 2024-11-18: qty 0.5

## 2024-11-18 MED ORDER — ALTEPLASE 2 MG IJ SOLR: 2.0000 mg | Freq: Once | INTRAMUSCULAR | Status: DC | PRN

## 2024-11-18 MED ORDER — ONDANSETRON HCL 4 MG/2ML IJ SOLN: 4.0000 mg | Freq: Four times a day (QID) | INTRAMUSCULAR | Status: DC | PRN

## 2024-11-18 MED ORDER — LIDOCAINE-PRILOCAINE 2.5-2.5 % EX CREA: 1.0000 | TOPICAL_CREAM | CUTANEOUS | Status: DC | PRN

## 2024-11-18 MED ORDER — CALCITRIOL 0.25 MCG PO CAPS
1.0000 ug | ORAL_CAPSULE | ORAL | Status: DC
  Administered 2024-11-20: 1 ug via ORAL
  Filled 2024-11-18: qty 4

## 2024-11-18 MED ORDER — PENTAFLUOROPROP-TETRAFLUOROETH EX AERO: 1.0000 | INHALATION_SPRAY | CUTANEOUS | Status: DC | PRN

## 2024-11-18 NOTE — Progress Notes (Signed)
 Pt receives out-pt HD at [facility], TTS, 0600am chair time. Contacted clinic who states his Hgb was 6.6. Will continue to assist as needed.   [facility] Erianna Jolly Dialysis Navigator [phone] [address] [address] #[phone]

## 2024-11-18 NOTE — Consult Note (Signed)
 ESRD Consult Note  Requesting provider: Dr. Charlyn  Reason for consult: ESRD, provision of dialysis  Assessment/Recommendations:  ESRD -outpatient HD orders: [facility].  4 hours.  EDW 89.5 kg.  LUE AVG 15-gauge.  Flow rates: 300/500.  2K/3 calcium .  Heparin : None.  Meds: Calcitriol  1 mcg with treatment, Sensipar  60 mg with treatment, Mircera 90 mcg (due 11/25), Venofer 50 mg weekly (due this week) -HD on Monday, Wednesday, Saturday (holiday schedule this week)  Anemia of Chronic Kidney Disease Acute on chronic blood loss anemia, GI bleed -Hemoglobin 6.8. receiving 1u prbc -Resuming ESA, checking iron stores -Transfuse PRN for Hgb <7 - GI bleed management per primary.  Eliquis and Plavix on hold.  GI to be consulted?  Volume/ hypertension  -Resume home meds, UF as tolerated with HD  Secondary Hyperparathyroidism/Hyperphosphatemia - Resuming calcitriol  and Sensipar , monitor Phos.  Resume home binders if on any  Severe PVD - Eliquis and Plavix on hold per primary team he has a history of chronic limb ischemia likely for angioplasties  DM2 - Management per primary  Recommendations were discussed with the primary team.  Ephriam Stank, MD [facility]  History of Present Illness: Jesse Nguyen is a/an 53 y.o. male with a past medical history of ESRD, hypertension, DM2, peripheral neuropathy, severe PVD, chronic anemia, hyperlipidemia who presents with severe anemia.  He was called by his dialysis facility to go to the ER for further evaluation.  Patient has been having weakness for several days.  Denies any overt bleeding.  Found to have a hemoglobin of 6.8 in the ER.  FOBT was positive.  Received 1 unit PRBC.  Also received IV hydralazine  for BP 185/157.  Chest x-ray without any active disease. Patient reports that he was scheduled to get his graft looked at some time this week. He otherwise reports that dialysis has been going well, no issues related to dialysis.  Denies any signs of bleeding. Other than weakness, no other complaints.   Medications:  Current Facility-Administered Medications  Medication Dose Route Frequency Provider Last Rate Last Admin   0.9 %  sodium chloride  infusion (Manually program via Guardrails IV Fluids)   Intravenous Once Charlyn Sora, MD       acetaminophen  (TYLENOL ) tablet 650 mg  650 mg Oral Q6H PRN Maree, Pratik D, DO       Or   acetaminophen  (TYLENOL ) suppository 650 mg  650 mg Rectal Q6H PRN Maree, Pratik D, DO       alteplase  (CATHFLO ACTIVASE ) injection 2 mg  2 mg Intracatheter Once PRN Jannah Guardiola, MD       amLODipine  (NORVASC ) tablet 10 mg  10 mg Oral Daily Maree, Pratik D, DO       anticoagulant sodium citrate  solution 5 mL  5 mL Intracatheter PRN Stank Ephriam, MD       [START ON 11/19/2024] Chlorhexidine  Gluconate Cloth 2 % PADS 6 each  6 each Topical Q0600 Stank Ephriam, MD       [START ON 11/19/2024] gabapentin  (NEURONTIN ) capsule 300 mg  300 mg Oral Q T,Th,Sa-HD Maree, Pratik D, DO       heparin  injection 1,000 Units  1,000 Units Intracatheter PRN Stank Ephriam, MD       hydrALAZINE  (APRESOLINE ) injection 10 mg  10 mg Intravenous Once Nanavati, Ankit, MD       hydrALAZINE  (APRESOLINE ) injection 10 mg  10 mg Intravenous Q4H PRN Maree, Pratik D, DO       lidocaine  (PF) (XYLOCAINE ) 1 %  injection 5 mL  5 mL Intradermal PRN Dennise Hoes, MD       lidocaine -prilocaine  (EMLA ) cream 1 Application  1 Application Topical PRN Dennise Hoes, MD       ondansetron  (ZOFRAN ) tablet 4 mg  4 mg Oral Q6H PRN Maree, Pratik D, DO       Or   ondansetron  (ZOFRAN ) injection 4 mg  4 mg Intravenous Q6H PRN Maree, Pratik D, DO       pantoprazole  (PROTONIX ) injection 40 mg  40 mg Intravenous Q12H Shah, Pratik D, DO       pentafluoroprop-tetrafluoroeth (GEBAUERS) aerosol 1 Application  1 Application Topical PRN Dennise Hoes, MD         ALLERGIES Other  MEDICAL HISTORY Past Medical History:  Diagnosis Date   CKD (chronic kidney disease),  stage III (HCC)    Diabetes mellitus without complication (HCC)    Dialysis patient    LUE restriction   Hepatitis C    HLD (hyperlipidemia)    Hypertension    Psoriasis      SOCIAL HISTORY Social History   Socioeconomic History   Marital status: Single    Spouse name: Not on file   Number of children: Not on file   Years of education: Not on file   Highest education level: Not on file  Occupational History   Not on file  Tobacco Use   Smoking status: Every Day    Current packs/day: 0.15    Types: Cigarettes   Smokeless tobacco: Never  Vaping Use   Vaping status: Never Used  Substance and Sexual Activity   Alcohol use: No   Drug use: Not Currently    Frequency: 7.0 times per week    Types: Marijuana   Sexual activity: Yes  Other Topics Concern   Not on file  Social History Narrative   Not on file   Social Drivers of Health   Financial Resource Strain: Low Risk (06/09/2021)   Received [facility]   Overall Financial Resource Strain (CARDIA)    Difficulty of Paying Living Expenses: Not hard at all  Food Insecurity: No Food Insecurity (11/18/2024)   Hunger Vital Sign    Worried About Running Out of Food in the Last Year: Never true    Ran Out of Food in the Last Year: Never true  Transportation Needs: No Transportation Needs (11/18/2024)   PRAPARE - Administrator, Civil Service (Medical): No    Lack of Transportation (Non-Medical): No  Physical Activity: Not on file  Stress: Not on file  Social Connections: Not on file  Intimate Partner Violence: Not At Risk (11/18/2024)   Humiliation, Afraid, Rape, and Kick questionnaire    Fear of Current or Ex-Partner: No    Emotionally Abused: No    Physically Abused: No    Sexually Abused: No     FAMILY HISTORY Family History  Problem Relation Age of Onset   Diabetes Father    Diabetes Sister      Review of Systems: 12 systems were reviewed and negative except per HPI  Physical  Exam: Vitals:   11/18/24 1512 11/18/24 1524  BP: (!) 161/85 (!) 170/83  Pulse: (!) 56   Resp: 13   Temp: (!) 97.3 F (36.3 C) 97.6 F (36.4 C)  SpO2: 100%    No intake/output data recorded. No intake or output data in the 24 hours ending 11/18/24 1536 General: well-appearing, no acute distress HEENT: anicteric sclera, MMM CV: normal  rate, no murmurs Lungs: bilateral chest rise, normal wob, cta bl Abd: soft, non-tender, non-distended Skin: no visible lesions or rashes Ext: no edema, right BKA Psych: alert, engaged, appropriate mood and affect Neuro: normal speech, no gross focal deficits  Dialysis access: LUE AVG in use  Test Results Reviewed Lab Results  Component Value Date   NA 138 11/18/2024   K 4.0 11/18/2024   CL 96 (L) 11/18/2024   CO2 28 11/18/2024   BUN 43 (H) 11/18/2024   CREATININE 9.07 (H) 11/18/2024   CALCIUM  9.6 11/18/2024   ALBUMIN 3.9 11/18/2024    I have reviewed relevant outside healthcare records

## 2024-11-18 NOTE — ED Provider Notes (Signed)
 [place] EMERGENCY DEPARTMENT AT [facility] Provider Note   CSN: [id number] Arrival date & time: 11/18/24  1121     Patient presents with: Anemia   Jesse Nguyen is a 53 y.o. male.  {Add pertinent medical, surgical, social history, OB history to YEP:67052} HPI    Patient comes in with chief complaint of weakness.  Prior to Admission medications   Medication Sig Start Date End Date Taking? Authorizing Provider  amLODipine  (NORVASC ) 5 MG tablet Take 1 tablet (5 mg total) by mouth daily. Patient taking differently: Take 10 mg by mouth daily. 04/21/20 02/28/24  Walisiewicz, Kaitlyn E, PA-C  apixaban (ELIQUIS) 5 MG TABS tablet Take 5 mg by mouth 2 (two) times daily.    [provider]  Aspirin-Acetaminophen -Caffeine (GOODY HEADACHE PO) Take 1-2 packets by mouth daily as needed (pain).    [provider]  bismuth subsalicylate (PEPTO BISMOL) 262 MG chewable tablet Chew 524 mg by mouth 3 (three) times a week.    [provider]  gabapentin  (NEURONTIN ) 300 MG capsule Take 300 mg by mouth Every Tuesday,Thursday,and Saturday with dialysis. 06/22/22   [provider]    Allergies: Other    Review of Systems  Updated Vital Signs BP (!) 185/157 (BP Location: Left Arm)   Pulse 63   Temp 98.3 F (36.8 C) (Oral)   Resp 16   Ht 6' (1.829 m)   Wt 86.4 kg   SpO2 100%   BMI 25.83 kg/m   Physical Exam  (all labs ordered are listed, but only abnormal results are displayed) Labs Reviewed  CBC WITH DIFFERENTIAL/PLATELET - Abnormal; Notable for the following components:      Result Value   RBC 2.04 (*)    Hemoglobin 6.8 (*)    HCT 21.4 (*)    MCV 104.9 (*)    All other components within normal limits  COMPREHENSIVE METABOLIC PANEL WITH GFR - Abnormal; Notable for the following components:   Chloride 96 (*)    Glucose, Bld 204 (*)    BUN 43 (*)    Creatinine, Ser 9.07 (*)    GFR, Estimated 6 (*)    All other components within normal  limits  POC OCCULT BLOOD, ED - Abnormal; Notable for the following components:   Fecal Occult Bld POSITIVE (*)    All other components within normal limits  TYPE AND SCREEN  ABO/RH  PREPARE RBC (CROSSMATCH)    EKG: None  Radiology: No results found.  {Document cardiac monitor, telemetry assessment procedure when appropriate:32947} Procedures   Medications Ordered in the ED  hydrALAZINE  (APRESOLINE ) injection 10 mg (has no administration in time range)  0.9 %  sodium chloride  infusion (Manually program via Guardrails IV Fluids) (has no administration in time range)    Clinical Course as of 11/18/24 1408  Mon Nov 18, 2024  1407 Hemoglobin(!!): 6.8 [AN]  1407 Hemoglobin(!!): 6.8 Hemoglobin 6.8.  5 months ago it was over 11.  IV Protonix  40 given.  Patient is on Eliquis, last dose was earlier today.  He is also on Plavix.  Patient has consented for 1 unit of blood.  Stable for admission at this time. [AN]    Clinical Course User Index [AN] Charlyn Sora, MD   {Click here for ABCD2, HEART and other calculators REFRESH Note before signing:1}                              Medical  Decision Making Amount and/or Complexity of Data Reviewed Labs: ordered. Decision-making details documented in ED Course. Radiology: ordered.  Risk Prescription drug management. Decision regarding hospitalization.   This patient presents to the ED with chief complaint(s) of *** with pertinent past medical history of *** .The complaint involves an extensive differential diagnosis and also carries with it a high risk of complications and morbidity.    The differential diagnosis considered for this patient includes:  Esophagitis Mallory Weiss tear [place]  Variceal bleeding PUD/Gastritis/ulcers Diverticular bleed Colon cancer Rectal bleed Internal hemorrhoids External hemorrhoids   The initial plan is to ***   Additional history obtained: Additional history obtained from  {additional history:26846} Records reviewed {records:26847}  Independent labs interpretation:  The following labs were independently interpreted: ***  Independent visualization and interpretation of imaging: - I independently visualized the following imaging with scope of interpretation limited to determining acute life threatening conditions related to emergency care: ***, which revealed ***  Treatment and Reassessment: ***  Consultation: - Consulted or discussed management/test interpretation with external professional: ***  Consideration for admission or further workup:  Social Determinants of health:   Final diagnoses:  Symptomatic anemia  Melena    ED Discharge Orders     None

## 2024-11-18 NOTE — Plan of Care (Signed)

## 2024-11-18 NOTE — Progress Notes (Signed)
 Informed by ER MD Dr. Charlyn for routine HD. ESRD on HD TTS. Will plan for HD Mon, Wed, Sat (holiday schedule this week). Heparin  free given concern for GI bleed. Routine HD ordered for today, full consult to follow in AM. Please call with any questions/concerns.

## 2024-11-18 NOTE — H&P (Signed)
 History and Physical    Montgomery Favor FMW:[id number] DOB: [birth year] DOA: 11/18/2024  PCP: Alston Silvio BROCKS, FNP   Patient coming from: Dialysis center  Chief Complaint: Low hemoglobin/weakness  HPI: Jesse Nguyen is a 53 y.o. male with medical history significant for type 2 diabetes with peripheral neuropathy, dyslipidemia, hypertension, ESRD on HD TTS, anemia of CKD, and severe PVD on Eliquis and Plavix who was at dialysis on 11/22 and was noted to have a low hemoglobin and thus was called by his dialysis facility regarding the need to go to the ED for further evaluation.  He was complaining of weakness for several days as well and denies any complaints of overt bleeding.  He denies any dizziness, abdominal pain, nausea, vomiting, or any other symptoms.  His last dose of Eliquis was this morning.   ED Course: Patient noted to have elevated blood pressure readings in the ED with creatinine 1.07 and hemoglobin 6.8.  FOBT noted to be positive.  1 unit PRBC ordered for transfusion and IV hydralazine  to be given for blood pressure management.  EDP has discussed case with GI and nephrology with nephrology planning for hemodialysis today.  Review of Systems: Reviewed as noted above, otherwise negative.  Past Medical History:  Diagnosis Date   CKD (chronic kidney disease), stage III (HCC)    Diabetes mellitus without complication (HCC)    Dialysis patient    LUE restriction   Hepatitis C    HLD (hyperlipidemia)    Hypertension    Psoriasis     Past Surgical History:  Procedure Laterality Date   A/V SHUNT INTERVENTION N/A 05/23/2024   Procedure: A/V SHUNT INTERVENTION;  Surgeon: Gretta Lonni PARAS, MD;  Location: [facility];  Service: Cardiovascular;  Laterality: N/A;   AV FISTULA PLACEMENT Left 11/22/2022   Procedure: LEFT ARM GRAFT CREATION;  Surgeon: Oris Krystal FALCON, MD;  Location: [facility];  Service: Vascular;  Laterality: Left;   EYE SURGERY     INCISION AND DRAINAGE BURSA / SUB-FASCIA OF  FOOT Right 03/02/2022   INTRAOPERATIVE ARTERIOGRAM Left 01/03/2022   PERIPHERAL VASCULAR THROMBECTOMY N/A 05/23/2024   Procedure: PERIPHERAL VASCULAR THROMBECTOMY;  Surgeon: Gretta Lonni PARAS, MD;  Location: [facility];  Service: Cardiovascular;  Laterality: N/A;   TRANSMETATARSAL AMPUTATION Left 01/04/2022   VENOUS ANGIOPLASTY  05/23/2024   Procedure: VENOUS ANGIOPLASTY;  Surgeon: Gretta Lonni PARAS, MD;  Location: [facility];  Service: Cardiovascular;;  Venous Anastomosis     reports that he has been smoking cigarettes. He has never used smokeless tobacco. He reports that he does not currently use drugs after having used the following drugs: Marijuana. Frequency: 7.00 times per week. He reports that he does not drink alcohol.  Allergies  Allergen Reactions   Other Rash    Fragrance soaps,perfumes    Family History  Problem Relation Age of Onset   Diabetes Father    Diabetes Sister     Prior to Admission medications   Medication Sig Start Date End Date Taking? Authorizing Provider  amLODipine  (NORVASC ) 5 MG tablet Take 1 tablet (5 mg total) by mouth daily. Patient taking differently: Take 10 mg by mouth daily. 04/21/20 02/28/24  Walisiewicz, Kaitlyn E, PA-C  apixaban (ELIQUIS) 5 MG TABS tablet Take 5 mg by mouth 2 (two) times daily.    [provider]  Aspirin-Acetaminophen -Caffeine (GOODY HEADACHE PO) Take 1-2 packets by mouth daily as needed (pain).    [provider]  bismuth subsalicylate (PEPTO BISMOL) 262 MG chewable  tablet Chew 524 mg by mouth 3 (three) times a week.    [provider]  gabapentin  (NEURONTIN ) 300 MG capsule Take 300 mg by mouth Every Tuesday,Thursday,and Saturday with dialysis. 06/22/22   [provider]    Physical Exam: Vitals:   11/18/24 1138 11/18/24 1139  BP: (!) 185/157   Pulse: 63   Resp: 16   Temp: 98.3 F (36.8 C)   TempSrc: Oral   SpO2: 100%   Weight:  86.4 kg  Height:  6' (1.829 m)    Constitutional:  NAD, calm, comfortable Vitals:   11/18/24 1138 11/18/24 1139  BP: (!) 185/157   Pulse: 63   Resp: 16   Temp: 98.3 F (36.8 C)   TempSrc: Oral   SpO2: 100%   Weight:  86.4 kg  Height:  6' (1.829 m)   Eyes: lids and conjunctivae normal Neck: normal, supple Respiratory: clear to auscultation bilaterally. Normal respiratory effort. No accessory muscle use.  Cardiovascular: Regular rate and rhythm, no murmurs. Abdomen: no tenderness, no distention. Bowel sounds positive.  Musculoskeletal: Right BKA, left TMA Skin: no rashes, lesions, ulcers.  Psychiatric: Flat affect  Labs on Admission: I have personally reviewed following labs and imaging studies  CBC: Recent Labs  Lab 11/18/24 1234  WBC 8.9  NEUTROABS 6.2  HGB 6.8*  HCT 21.4*  MCV 104.9*  PLT 259   Basic Metabolic Panel: Recent Labs  Lab 11/18/24 1234  NA 138  K 4.0  CL 96*  CO2 28  GLUCOSE 204*  BUN 43*  CREATININE 9.07*  CALCIUM  9.6   GFR: Estimated Creatinine Clearance: 10.2 mL/min (A) (by C-G formula based on SCr of 9.07 mg/dL (H)). Liver Function Tests: Recent Labs  Lab 11/18/24 1234  AST 21  ALT 16  ALKPHOS 61  BILITOT 0.3  PROT 7.3  ALBUMIN 3.9   No results for input(s): LIPASE, AMYLASE in the last 168 hours. No results for input(s): AMMONIA in the last 168 hours. Coagulation Profile: No results for input(s): INR, PROTIME in the last 168 hours. Cardiac Enzymes: No results for input(s): CKTOTAL, CKMB, CKMBINDEX, TROPONINI in the last 168 hours. BNP (last 3 results) No results for input(s): PROBNP in the last 8760 hours. HbA1C: No results for input(s): HGBA1C in the last 72 hours. CBG: No results for input(s): GLUCAP in the last 168 hours. Lipid Profile: No results for input(s): CHOL, HDL, LDLCALC, TRIG, CHOLHDL, LDLDIRECT in the last 72 hours. Thyroid Function Tests: No results for input(s): TSH, T4TOTAL, FREET4, T3FREE, THYROIDAB in the last  72 hours. Anemia Panel: No results for input(s): VITAMINB12, FOLATE, FERRITIN, TIBC, IRON, RETICCTPCT in the last 72 hours. Urine analysis:    Component Value Date/Time   COLORURINE AMBER (A) 04/17/2019 0959   APPEARANCEUR CLEAR 04/17/2019 0959   LABSPEC 1.034 (H) 04/17/2019 0959   PHURINE 5.0 04/17/2019 0959   GLUCOSEU 50 (A) 04/17/2019 0959   HGBUR SMALL (A) 04/17/2019 0959   BILIRUBINUR SMALL (A) 04/17/2019 0959   KETONESUR 5 (A) 04/17/2019 0959   PROTEINUR 100 (A) 04/17/2019 0959   NITRITE NEGATIVE 04/17/2019 0959   LEUKOCYTESUR NEGATIVE 04/17/2019 0959    Radiological Exams on Admission: No results found.  EKG: Independently reviewed.  NSR 61 bpm.  Assessment/Plan Active Problems:   * No active hospital problems. *    Symptomatic acute on chronic blood loss anemia likely in the setting of GI bleed - Prior hemoglobin levels around 11 noted about 5 months ago, currently 6.8 - Transfuse 1  unit PRBC and monitor - PPI IV twice daily - Hold Eliquis and Plavix  Uncontrolled hypertension - Plan to give IV hydralazine  and perform hemodialysis - Continue amlodipine  - Monitor closely in stepdown and initiate drip as needed - Will continue IV hydralazine  as needed  ESRD on HD TTS - Appreciate nephrology with plans for hemodialysis today with 1 unit PRBC transfusion  Severe PVD - Noted to have issues with critical limb ischemia with need for angioplasties - Hold Plavix and Eliquis  DVT prophylaxis: SCDs Code Status: Full Family Communication: None at bedside Disposition Plan: Admit for PRBC transfusion and GI evaluation as well as dialysis Consults called: Nephrology, GI Admission status: Observation, stepdown  Severity of Illness: The appropriate patient status for this patient is OBSERVATION. Observation status is judged to be reasonable and necessary in order to provide the required intensity of service to ensure the patient's safety. The patient's  presenting symptoms, physical exam findings, and initial radiographic and laboratory data in the context of their medical condition is felt to place them at decreased risk for further clinical deterioration. Furthermore, it is anticipated that the patient will be medically stable for discharge from the hospital within 2 midnights of admission.    Jyden Kromer D Maree DO [facility]  If 7PM-7AM, please contact night-coverage [email or web address]  11/18/2024, 2:10 PM

## 2024-11-18 NOTE — Progress Notes (Signed)
 The patient has completed the dialysis treatment with 3 liters of fluid removed. One unit of PRBC was transfused without any adverse reactions. HD BFR 350 ml/min.  11/18/24 1846  Vitals  Temp 98.4 F (36.9 C)  Temp Source Oral  BP Location Right Arm  BP Method Automatic  Patient Position (if appropriate) Lying  Pulse Rate 60  Weight 89 kg  Type of Weight Post-Dialysis  Oxygen Therapy  O2 Device Room Air  During Treatment Monitoring  Intra-Hemodialysis Comments Tx completed  Post Treatment  Dialyzer Clearance Lightly streaked  Hemodialysis Intake (mL) 333 mL  Liters Processed 68  Fluid Removed (mL) 3000 mL  Tolerated HD Treatment Yes  Post-Hemodialysis Comments see notes  AVG/AVF Arterial Site Held (minutes) 7 minutes  AVG/AVF Venous Site Held (minutes) 7 minutes

## 2024-11-18 NOTE — ED Notes (Signed)
 1st attempt at report. Nurse at lunch.

## 2024-11-18 NOTE — ED Triage Notes (Signed)
 Pt arrived via POV after receiving a phone call from his dialysis center who reported his Hgb was 6.something? Pt unable to recall the exact number. Pt endorses dizziness, fatigue and Sob. Pt is a Tues, Thurs, [id number]. Dialysis Pt with a LUE restriction.

## 2024-11-19 ENCOUNTER — Encounter (HOSPITAL_COMMUNITY): Payer: Self-pay | Admitting: Internal Medicine

## 2024-11-19 DIAGNOSIS — D62 Acute posthemorrhagic anemia: Secondary | ICD-10-CM | POA: Diagnosis present

## 2024-11-19 DIAGNOSIS — D509 Iron deficiency anemia, unspecified: Secondary | ICD-10-CM | POA: Diagnosis not present

## 2024-11-19 LAB — TYPE AND SCREEN
ABO/RH(D): A POS
Antibody Screen: NEGATIVE
Unit division: 0

## 2024-11-19 LAB — CBC WITH DIFFERENTIAL/PLATELET
Abs Immature Granulocytes: 0.03 K/uL (ref 0.00–0.07)
Basophils Absolute: 0.1 K/uL (ref 0.0–0.1)
Basophils Relative: 1 %
Eosinophils Absolute: 0.2 K/uL (ref 0.0–0.5)
Eosinophils Relative: 2 %
HCT: 21.4 % — ABNORMAL LOW (ref 39.0–52.0)
Hemoglobin: 6.8 g/dL — CL (ref 13.0–17.0)
Immature Granulocytes: 0 %
Lymphocytes Relative: 19 %
Lymphs Abs: 1.7 K/uL (ref 0.7–4.0)
MCH: 33.3 pg (ref 26.0–34.0)
MCHC: 31.8 g/dL (ref 30.0–36.0)
MCV: 104.9 fL — ABNORMAL HIGH (ref 80.0–100.0)
Monocytes Absolute: 0.6 K/uL (ref 0.1–1.0)
Monocytes Relative: 7 %
Neutro Abs: 6.2 K/uL (ref 1.7–7.7)
Neutrophils Relative %: 71 %
Platelets: 259 K/uL (ref 150–400)
RBC: 2.04 MIL/uL — ABNORMAL LOW (ref 4.22–5.81)
RDW: 15.3 % (ref 11.5–15.5)
Smear Review: NORMAL
WBC: 8.9 K/uL (ref 4.0–10.5)
nRBC: 0 % (ref 0.0–0.2)

## 2024-11-19 LAB — BASIC METABOLIC PANEL WITH GFR
Anion gap: 12 (ref 5–15)
BUN: 27 mg/dL — ABNORMAL HIGH (ref 6–20)
CO2: 29 mmol/L (ref 22–32)
Calcium: 9.2 mg/dL (ref 8.9–10.3)
Chloride: 97 mmol/L — ABNORMAL LOW (ref 98–111)
Creatinine, Ser: 6.48 mg/dL — ABNORMAL HIGH (ref 0.61–1.24)
GFR, Estimated: 9 mL/min — ABNORMAL LOW (ref >60)
Glucose, Bld: 117 mg/dL — ABNORMAL HIGH (ref 70–99)
Potassium: 3.9 mmol/L (ref 3.5–5.1)
Sodium: 137 mmol/L (ref 135–145)

## 2024-11-19 LAB — PHOSPHORUS: Phosphorus: 4.6 mg/dL (ref 2.5–4.6)

## 2024-11-19 LAB — CBC
HCT: 26.9 % — ABNORMAL LOW (ref 39.0–52.0)
Hemoglobin: 8.8 g/dL — ABNORMAL LOW (ref 13.0–17.0)
MCH: 32.4 pg (ref 26.0–34.0)
MCHC: 32.7 g/dL (ref 30.0–36.0)
MCV: 98.9 fL (ref 80.0–100.0)
Platelets: 286 K/uL (ref 150–400)
RBC: 2.72 MIL/uL — ABNORMAL LOW (ref 4.22–5.81)
RDW: 18 % — ABNORMAL HIGH (ref 11.5–15.5)
WBC: 9.2 K/uL (ref 4.0–10.5)
nRBC: 0 % (ref 0.0–0.2)

## 2024-11-19 LAB — BPAM RBC
Blood Product Expiration Date: 202512062359
ISSUE DATE / TIME: 202511241500
Unit Type and Rh: 6200

## 2024-11-19 LAB — HEPATITIS B SURFACE ANTIBODY, QUANTITATIVE: Hep B S AB Quant (Post): <3.5 m[IU]/mL — ABNORMAL LOW

## 2024-11-19 LAB — FERRITIN: Ferritin: 310 ng/mL (ref 24–336)

## 2024-11-19 LAB — IRON AND TIBC
Iron: 48 ug/dL (ref 45–182)
Saturation Ratios: 15 % — ABNORMAL LOW (ref 17.9–39.5)
TIBC: 325 ug/dL (ref 250–450)
UIBC: 277 ug/dL

## 2024-11-19 LAB — MAGNESIUM: Magnesium: 2.3 mg/dL (ref 1.7–2.4)

## 2024-11-19 LAB — HIV ANTIBODY (ROUTINE TESTING W REFLEX): HIV Screen 4th Generation wRfx: NONREACTIVE

## 2024-11-19 MED ORDER — PEG 3350-KCL-NA BICARB-NACL 420 G PO SOLR
4000.0000 mL | Freq: Once | ORAL | Status: AC
  Administered 2024-11-19: 4000 mL via ORAL

## 2024-11-19 MED ORDER — GABAPENTIN 300 MG PO CAPS
300.0000 mg | ORAL_CAPSULE | Freq: Every day | ORAL | Status: DC
  Administered 2024-11-19: 300 mg via ORAL
  Filled 2024-11-19 (×2): qty 1

## 2024-11-19 MED ORDER — CALCIUM ACETATE (PHOS BINDER) 667 MG PO CAPS
667.0000 mg | ORAL_CAPSULE | Freq: Three times a day (TID) | ORAL | Status: DC
  Administered 2024-11-19 – 2024-11-20 (×3): 667 mg via ORAL
  Filled 2024-11-19 (×3): qty 1

## 2024-11-19 NOTE — TOC CM/SW Note (Signed)
 Transition of Care [facility]) - Inpatient Brief Assessment   Patient Details  Name: Jesse Nguyen MRN: [id number] Date of Birth: October 02, 1971  Transition of Care [facility]) CM/SW Contact:    Lucie Lunger, LCSWA Phone Number: 11/19/2024, 10:30 AM   Clinical Narrative: Transition of Care Department [facility] has reviewed patient and no TOC needs have been identified at this time. We will continue to monitor patient advancement through interdiciplinary progression rounds. If new patient transition needs arise, please place a TOC consult.  Transition of Care Asessment: Insurance and Status: Insurance coverage has been reviewed Patient has primary care physician: Yes Home environment has been reviewed: From home Prior level of function:: Independent Prior/Current Home Services: No current home services Social Drivers of Health Review: SDOH reviewed no interventions necessary Readmission risk has been reviewed: Yes Transition of care needs: no transition of care needs at this time

## 2024-11-19 NOTE — Consult Note (Signed)
 Gastroenterology Consult   Referring Provider: Dr Maree Primary Care Physician:  Alston Silvio BROCKS, FNP Primary Gastroenterologist:  Dr. Eartha, previously unassigned  Patient ID: Jesse Nguyen; [id number]; 1971/07/25   Admit date: 11/18/2024  LOS: 0 days   Date of Consultation: 11/19/2024  Reason for Consultation:  Acute blood loss anemia  History of Present Illness   Jesse Nguyen is a 53 y.o. year old male with past medical history of ESRD on dialysis, anemia of chronic disease, severe PVD with prior right BKA and left transmetatarsal amputation with recent angioplasty and stenting of the left superficial femoral artery and angioplasty of left anterior tibial due Aug 2025 to chronic limb-threatening ischemia with tissue loss, needing arteriogram in the future, on Eliquis and Plavix, DM, HTN, peripheral neuropathy, reported hepatitis C s/p treatment per patient while incarcerated about 10 years ago, left foot ulcer requiring wound care serially at [facility], presenting to the ED yesterday from dialysis with acute on chronic anemia and heme positive stool. GI now consulted.   In the ED: Hgb 6.8, receiving 1 unit PRBCs. Improved to 9.5, now 8.8 this morning.  Heme positive. Baseline around 11 range. Ferritin 310 but drawn after transfusion. LFTs normal, Creatinine 9.07. CXR normal.   Today: He notes no overt GI bleed, no melena or hematochezia. Has felt fatigued last few days. No chest pain or shortness of breath. On plavix and Eliquis for several months. Last dose of Eliquis and Plavix on Sunday morning.  Feels nauseated after dialysis. Tums for GERD. Used to be on Prilosec but stopped 2 years ago. No dysphagia. No abdominal pain currently. About 2 weeks ago took tylenol  and 1-2 goody powders a day daily for 2 weeks as he had run out of gabapentin  and had lower extremity pain.   No changes in bowel habits. No unexplained weight loss or lack of appetite.   Dialysis  T/TH/Sat   No  prior colonoscopy/EGD  No FH colon cancer or polyps No ETOH use No drug use +smoker No known liver disease     Past Medical History:  Diagnosis Date   CKD (chronic kidney disease), stage III (HCC)    Diabetes mellitus without complication (HCC)    Dialysis patient    LUE restriction   Hepatitis C    HLD (hyperlipidemia)    Hypertension    Psoriasis     Past Surgical History:  Procedure Laterality Date   A/V SHUNT INTERVENTION N/A 05/23/2024   Procedure: A/V SHUNT INTERVENTION;  Surgeon: Gretta Lonni PARAS, MD;  Location: [facility];  Service: Cardiovascular;  Laterality: N/A;   AV FISTULA PLACEMENT Left 11/22/2022   Procedure: LEFT ARM GRAFT CREATION;  Surgeon: Oris Krystal FALCON, MD;  Location: [facility];  Service: Vascular;  Laterality: Left;   EYE SURGERY     INCISION AND DRAINAGE BURSA / SUB-FASCIA OF FOOT Right 03/02/2022   INTRAOPERATIVE ARTERIOGRAM Left 01/03/2022   PERIPHERAL VASCULAR THROMBECTOMY N/A 05/23/2024   Procedure: PERIPHERAL VASCULAR THROMBECTOMY;  Surgeon: Gretta Lonni PARAS, MD;  Location: [facility];  Service: Cardiovascular;  Laterality: N/A;   TRANSMETATARSAL AMPUTATION Left 01/04/2022   VENOUS ANGIOPLASTY  05/23/2024   Procedure: VENOUS ANGIOPLASTY;  Surgeon: Gretta Lonni PARAS, MD;  Location: [facility];  Service: Cardiovascular;;  Venous Anastomosis    Prior to Admission medications   Medication Sig Start Date End Date Taking? Authorizing Provider  amLODipine  (NORVASC ) 5 MG tablet Take 1 tablet (5 mg total) by mouth daily. Patient taking differently: Take  10 mg by mouth daily. 04/21/20 02/28/24  Walisiewicz, Kaitlyn E, PA-C  apixaban (ELIQUIS) 5 MG TABS tablet Take 5 mg by mouth 2 (two) times daily.    [provider]  Aspirin-Acetaminophen -Caffeine (GOODY HEADACHE PO) Take 1-2 packets by mouth daily as needed (pain).    [provider]  bismuth subsalicylate (PEPTO BISMOL) 262 MG chewable tablet Chew 524 mg by mouth 3 (three) times a  week.    [provider]  calcium  acetate (PHOSLO ) 667 MG capsule Take 667 mg by mouth 3 (three) times daily with meals. 08/22/24   [provider]  carvedilol (COREG) 12.5 MG tablet Take 12.5 mg by mouth 2 (two) times daily with a meal.    [provider]  clopidogrel (PLAVIX) 75 MG tablet Take 75 mg by mouth daily. 08/06/24   [provider]  gabapentin  (NEURONTIN ) 300 MG capsule Take 300 mg by mouth Every Tuesday,Thursday,and Saturday with dialysis. 06/22/22   [provider]  RENVELA 800 MG tablet Take 2,400 mg by mouth 3 (three) times daily. 08/16/24   [provider]  sitaGLIPtin  (JANUVIA ) 25 MG tablet Take 25 mg by mouth daily. 07/31/24   [provider]    Current Facility-Administered Medications  Medication Dose Route Frequency Provider Last Rate Last Admin   0.9 %  sodium chloride  infusion (Manually program via Guardrails IV Fluids)   Intravenous Once Charlyn Sora, MD       acetaminophen  (TYLENOL ) tablet 650 mg  650 mg Oral Q6H PRN Maree, Pratik D, DO       Or   acetaminophen  (TYLENOL ) suppository 650 mg  650 mg Rectal Q6H PRN Maree, Pratik D, DO       amLODipine  (NORVASC ) tablet 10 mg  10 mg Oral Daily Maree, Pratik D, DO   10 mg at 11/19/24 9183   calcitRIOL  (ROCALTROL ) capsule 1 mcg  1 mcg Oral Q M,W,F-HD Singh, Vikas, MD       calcium  acetate (PHOSLO ) capsule 667 mg  667 mg Oral TID WC Kruska, Lindsay A, MD       Chlorhexidine  Gluconate Cloth 2 % PADS 6 each  6 each Topical Q0600 Dennise Hoes, MD   6 each at 11/19/24 9372   cinacalcet  (SENSIPAR ) tablet 60 mg  60 mg Oral Q M,W,F-HD Dennise Hoes, MD       NOREEN ON 11/20/2024] Darbepoetin Alfa  (ARANESP ) injection 100 mcg  100 mcg Subcutaneous Once Dennise Hoes, MD       gabapentin  (NEURONTIN ) capsule 300 mg  300 mg Oral Daily Maree, Pratik D, DO       hydrALAZINE  (APRESOLINE ) injection 10 mg  10 mg Intravenous Once Nanavati, Ankit, MD       hydrALAZINE  (APRESOLINE ) injection  10 mg  10 mg Intravenous Q4H PRN Maree, Pratik D, DO   10 mg at 11/18/24 2210   ondansetron  (ZOFRAN ) tablet 4 mg  4 mg Oral Q6H PRN Maree, Pratik D, DO       Or   ondansetron  (ZOFRAN ) injection 4 mg  4 mg Intravenous Q6H PRN Maree, Pratik D, DO       pantoprazole  (PROTONIX ) injection 40 mg  40 mg Intravenous Q12H Shah, Pratik D, DO   40 mg at 11/19/24 9182    Allergies as of 11/18/2024 - Review Complete 11/18/2024  Allergen Reaction Noted   Other Rash 12/04/2013    Family History  Problem Relation Age of Onset   Diabetes Father    Diabetes Sister  Social History   Socioeconomic History   Marital status: Single    Spouse name: Not on file   Number of children: Not on file   Years of education: Not on file   Highest education level: Not on file  Occupational History   Not on file  Tobacco Use   Smoking status: Every Day    Current packs/day: 0.15    Types: Cigarettes   Smokeless tobacco: Never  Vaping Use   Vaping status: Never Used  Substance and Sexual Activity   Alcohol use: No   Drug use: Not Currently    Frequency: 7.0 times per week    Types: Marijuana   Sexual activity: Yes  Other Topics Concern   Not on file  Social History Narrative   Not on file   Social Drivers of Health   Financial Resource Strain: Low Risk (06/09/2021)   Received from [facility]   Overall Financial Resource Strain (CARDIA)    Difficulty of Paying Living Expenses: Not hard at all  Food Insecurity: No Food Insecurity (11/18/2024)   Hunger Vital Sign    Worried About Running Out of Food in the Last Year: Never true    Ran Out of Food in the Last Year: Never true  Transportation Needs: No Transportation Needs (11/18/2024)   PRAPARE - Administrator, Civil Service (Medical): No    Lack of Transportation (Non-Medical): No  Physical Activity: Not on file  Stress: Not on file  Social Connections: Not on file  Intimate Partner Violence: Not At Risk (11/18/2024)    Humiliation, Afraid, Rape, and Kick questionnaire    Fear of Current or Ex-Partner: No    Emotionally Abused: No    Physically Abused: No    Sexually Abused: No     Review of Systems   Gen: Denies any fever, chills, loss of appetite, change in weight or weight loss CV: Denies chest pain, heart palpitations, syncope, edema  Resp: Denies shortness of breath with rest, cough, wheezing, coughing up blood, and pleurisy. GI: Denies vomiting blood, jaundice, and fecal incontinence.   Denies dysphagia or odynophagia. GU : Denies urinary burning, blood in urine, urinary frequency, and urinary incontinence. MS: see HPI Derm: Denies rash, itching, dry skin, hives. Psych: Denies depression, anxiety, memory loss, hallucinations, and confusion. Heme: Denies bruising or bleeding Neuro:  Denies any headaches, dizziness, paresthesias, shaking  Physical Exam   Vital Signs in last 24 hours: Temp:  [97.3 F (36.3 C)-98.6 F (37 C)] 98.6 F (37 C) (11/25 0753) Pulse Rate:  [55-63] 61 (11/25 0200) Resp:  [9-23] 15 (11/25 0846) BP: (111-195)/(52-157) 169/66 (11/25 0846) SpO2:  [97 %-100 %] 98 % (11/25 0846) Weight:  [86.4 kg-92 kg] 89 kg (11/24 1846) Last BM Date : 11/18/24  General:   Alert,  Well-developed, well-nourished, pleasant and cooperative in NAD Head:  Normocephalic and atraumatic. Eyes:  Sclera clear, no icterus.   Conjunctiva pink. Lungs:  Clear throughout to auscultation.    Heart:  S1 S2 present  Abdomen:  Soft, nontender and nondistended. No masses, hepatosplenomegaly or hernias noted. Normal bowel sounds, without guarding, and without rebound.   Rectal: deferred   Extremities:  Right BKA, Left foot transmetatarsal amputation and chronic wound,  Neurologic:  Alert and  oriented x4. Psych:  Alert and cooperative. Normal mood and affect.  Intake/Output from previous day: 11/24 0701 - 11/25 0700 In: 145 [I.V.:100; Blood:45] Out: 3000  Intake/Output this shift: No  intake/output data recorded.  Labs/Studies   Recent Labs Recent Labs    11/18/24 1234 11/18/24 1421 11/18/24 2051 11/19/24 0508  WBC 8.9 9.0  --  9.2  HGB 6.8* 7.1* 9.5* 8.8*  HCT 21.4* 22.1* 29.5* 26.9*  PLT 259 260  --  286   BMET Recent Labs    11/18/24 1234 11/19/24 0508  NA 138 137  K 4.0 3.9  CL 96* 97*  CO2 28 29  GLUCOSE 204* 117*  BUN 43* 27*  CREATININE 9.07* 6.48*  CALCIUM  9.6 9.2   LFT Recent Labs    11/18/24 1234  PROT 7.3  ALBUMIN 3.9  AST 21  ALT 16  ALKPHOS 61  BILITOT 0.3    Hepatitis Panel Recent Labs    11/18/24 1421  HEPBSAG NON REACTIVE     Radiology/Studies DG Chest Port 1 View Result Date: 11/18/2024 CLINICAL DATA:  Shortness of breath EXAM: PORTABLE CHEST 1 VIEW COMPARISON:  None Available. FINDINGS: The heart size and mediastinal contours are within normal limits. Both lungs are clear. The visualized skeletal structures are unremarkable. IMPRESSION: No active disease. Electronically Signed   By: Greig Pique M.D.   On: 11/18/2024 14:54     Assessment   Jesse Nguyen is a 53 y.o. year old male  with past medical history of ESRD on dialysis, anemia of chronic disease, severe PVD with prior right BKA and left transmetatarsal amputation with recent angioplasty and stenting of the left superficial femoral artery and angioplasty of left anterior tibial due Aug 2025 to chronic limb-threatening ischemia with tissue loss, needing arteriogram in the future, on Eliquis and Plavix, DM, HTN, peripheral neuropathy, reported hepatitis C s/p treatment per patient while incarcerated about 10 years ago, left foot ulcer requiring wound care serially at [facility], presenting to the ED yesterday from dialysis with acute on chronic anemia and heme positive stool in setting of concomitant Eliquis and Plavix. GI now consulted.   Acute blood loss anemia with heme positive stool: no overt bleeding noted, Hgb baseline appears in the 11 range and  presented with Hgb 6.8, improved with 1 unit PRBCs and 8.8 this morning. Ferritin and iron drawn post-transfusion so not completely accurate. In setting of recent aspirin powders for several weeks, suspect UGI source but unable to rule out small bowel such as AVMs or colonic etiology as well. No prior EGD/colonoscopy. Continues to be without overt GI bleeding. Will arrange diagnostic colonoscopy and EGD on 11/26.  Last dose of Plavix and Eliquis both on Sunday morning and remains on hold.  History of chronic Hep C: s/p treatment per patient years ago while incarcerated. No known liver disease. Outside records with imaging in past reviewed and no liver fibrosis/cirrhosis appreciated Feb 2024 on CTA. Check Hep C RNA. Unknown if fibrosis prior to treatment and if any advanced fibrosis (F3) would recommend surveillance for HCC as at high risk.     Plan / Recommendations    Continue to hold Eliquis and Plavix PPI BID Clear liquids, NPO after midnight Bowel prep today Colonoscopy/EGD on 11/26 Check HCV RNA. Unknown if prior fibrosis, and this would determine HCC screening in future Avoid all aspirin powders     11 /25/2025, 10:47 AM  Therisa MICAEL Stager, PhD, ANP-BC [facility]

## 2024-11-19 NOTE — Progress Notes (Signed)
 PROGRESS NOTE    Jesse Nguyen  FMW:[id number] DOB: 02-Jun-1971 DOA: 11/18/2024 PCP: Alston Silvio BROCKS, FNP   Brief Narrative:    Jesse Nguyen is a 53 y.o. male with medical history significant for type 2 diabetes with peripheral neuropathy, dyslipidemia, hypertension, ESRD on HD TTS, anemia of CKD, and severe PVD on Eliquis and Plavix who was at dialysis on 11/22 and was noted to have a low hemoglobin and thus was called by his dialysis facility regarding the need to go to the ED for further evaluation.  He was complaining of weakness for several days as well and denies any complaints of overt bleeding.  He was admitted for symptomatic acute on chronic blood loss anemia with noted dark stools and positive Hemoccult in the setting of anticoagulant use.  GI plans for upper and lower endoscopy 11/26.  He has received 1 unit PRBC transfusion on 11/24 with hemodialysis.  Assessment & Plan:   Active Problems:   * No active hospital problems. *  Assessment and Plan:   Symptomatic acute on chronic blood loss anemia likely in the setting of GI bleed-improved with PRBC transfusion on 11/24 - Prior hemoglobin levels around 11 noted about 5 months ago, currently 6.8 - Monitor a.m. CBC - PPI IV twice daily - Hold Eliquis and Plavix -GI plans for upper and lower endoscopy 11/26, n.p.o. after midnight, currently on clear liquid diet   Uncontrolled hypertension - Plan to give IV hydralazine  and perform hemodialysis - Continue amlodipine  - Monitor closely in stepdown and initiate drip as needed - Will continue IV hydralazine  as needed -Okay to transfer to telemetry   ESRD on HD TTS - Hemodialysis performed 11/24 Appreciate nephrology following with plans for further hemodialysis 11/26-   Severe PVD - Noted to have issues with critical limb ischemia with need for angioplasties - Hold Plavix and Eliquis - Resume home gabapentin  300 mg daily as he states that he takes at home for pain.  Boil noted  over left eye - Warm compress  DVT prophylaxis:SCDs Code Status: Full Family Communication: None at bedside Disposition Plan:  Status is: Observation The patient will require care spanning > 2 midnights and should be moved to inpatient because: Need for inpatient endoscopy.   Consultants:  GI Nephrology  Procedures:  None  Antimicrobials:  None   Subjective: Patient seen and evaluated today with no new acute complaints or concerns. No acute concerns or events noted overnight.  Complains of some pain to his legs and is asking for gabapentin .  Objective: Vitals:   11/19/24 0600 11/19/24 0753 11/19/24 0816 11/19/24 0846  BP: 136/65  (!) 132/102 (!) 169/66  Pulse:      Resp: (!) 9   15  Temp:  98.6 F (37 C)    TempSrc:  Oral    SpO2:    98%  Weight:      Height:        Intake/Output Summary (Last 24 hours) at 11/19/2024 1225 Last data filed at 11/18/2024 1846 Gross per 24 hour  Intake 145 ml  Output 3000 ml  Net -2855 ml   Filed Weights   11/18/24 1139 11/18/24 1513 11/18/24 1846  Weight: 86.4 kg 92 kg 89 kg    Examination:  General exam: Appears calm and comfortable  Respiratory system: Clear to auscultation. Respiratory effort normal. Cardiovascular system: S1 & S2 heard, RRR.  Gastrointestinal system: Abdomen is soft Central nervous system: Alert and awake Extremities: Right-sided BKA, left TMA Skin: No significant lesions  noted Psychiatry: Flat affect.    Data Reviewed: I have personally reviewed following labs and imaging studies  CBC: Recent Labs  Lab 11/18/24 1234 11/18/24 1421 11/18/24 2051 11/19/24 0508  WBC 8.9 9.0  --  9.2  NEUTROABS 6.2  --   --   --   HGB 6.8* 7.1* 9.5* 8.8*  HCT 21.4* 22.1* 29.5* 26.9*  MCV 104.9* 105.2*  --  98.9  PLT 259 260  --  286   Basic Metabolic Panel: Recent Labs  Lab 11/18/24 1234 11/19/24 0508  NA 138 137  K 4.0 3.9  CL 96* 97*  CO2 28 29  GLUCOSE 204* 117*  BUN 43* 27*  CREATININE 9.07*  6.48*  CALCIUM  9.6 9.2  MG  --  2.3  PHOS  --  4.6   GFR: Estimated Creatinine Clearance: 14.3 mL/min (A) (by C-G formula based on SCr of 6.48 mg/dL (H)). Liver Function Tests: Recent Labs  Lab 11/18/24 1234  AST 21  ALT 16  ALKPHOS 61  BILITOT 0.3  PROT 7.3  ALBUMIN 3.9   No results for input(s): LIPASE, AMYLASE in the last 168 hours. No results for input(s): AMMONIA in the last 168 hours. Coagulation Profile: No results for input(s): INR, PROTIME in the last 168 hours. Cardiac Enzymes: No results for input(s): CKTOTAL, CKMB, CKMBINDEX, TROPONINI in the last 168 hours. BNP (last 3 results) No results for input(s): PROBNP in the last 8760 hours. HbA1C: No results for input(s): HGBA1C in the last 72 hours. CBG: No results for input(s): GLUCAP in the last 168 hours. Lipid Profile: No results for input(s): CHOL, HDL, LDLCALC, TRIG, CHOLHDL, LDLDIRECT in the last 72 hours. Thyroid Function Tests: No results for input(s): TSH, T4TOTAL, FREET4, T3FREE, THYROIDAB in the last 72 hours. Anemia Panel: Recent Labs    11/19/24 0508  FERRITIN 310  TIBC 325  IRON 48   Sepsis Labs: No results for input(s): PROCALCITON, LATICACIDVEN in the last 168 hours.  Recent Results (from the past 240 hours)  MRSA Next Gen by PCR, Nasal     Status: None   Collection Time: 11/18/24  7:10 PM   Specimen: Nasal Mucosa; Nasal Swab  Result Value Ref Range Status   MRSA by PCR Next Gen NOT DETECTED NOT DETECTED Final    Comment: (NOTE) The GeneXpert MRSA Assay (FDA approved for NASAL specimens only), is one component of a comprehensive MRSA colonization surveillance program. It is not intended to diagnose MRSA infection nor to guide or monitor treatment for MRSA infections. Test performance is not FDA approved in patients less than 59 years old. Performed at [facility], [address], [place], [place] [address]          Radiology  Studies: [facility] Chest [facility] 1 View Result Date: 11/18/2024 CLINICAL DATA:  Shortness of breath EXAM: PORTABLE CHEST 1 VIEW COMPARISON:  None Available. FINDINGS: The heart size and mediastinal contours are within normal limits. Both lungs are clear. The visualized skeletal structures are unremarkable. IMPRESSION: No active disease. Electronically Signed   By: Greig Pique M.D.   On: 11/18/2024 14:54        Scheduled Meds:  sodium chloride    Intravenous Once   amLODipine   10 mg Oral Daily   calcitRIOL   1 mcg Oral Q M,W,F-HD   calcium  acetate  667 mg Oral TID WC   Chlorhexidine  Gluconate Cloth  6 each Topical Q0600   cinacalcet   60 mg Oral Q M,W,F-HD   [START ON 11/20/2024] darbepoetin (  ARANESP ) injection - DIALYSIS  100 mcg Subcutaneous Once   gabapentin   300 mg Oral Daily   hydrALAZINE   10 mg Intravenous Once   pantoprazole  (PROTONIX ) IV  40 mg Intravenous Q12H     LOS: 0 days    Time spent: 55 minutes    Aleysia Oltmann JONETTA Fairly, DO [facility]  If 7PM-7AM, please contact night-coverage [email or web address] 11/19/2024, 12:25 PM

## 2024-11-19 NOTE — Plan of Care (Signed)

## 2024-11-19 NOTE — Progress Notes (Signed)
 [facility] Progress Note   Subjective:   HD with 3L UF yesterday well tolerated. Feeling fine this AM.   Objective Vitals:   11/19/24 0400 11/19/24 0500 11/19/24 0600 11/19/24 0753  BP: (!) 147/66 (!) 157/66 136/65   Pulse:      Resp: 13 13 (!) 9   Temp:    98.6 F (37 C)  TempSrc:    Oral  SpO2: 97%     Weight:      Height:       Physical Exam General: sitting at edge of bed comfortably Heart: BP and HR on monitor reviewed Lungs: normal WOB on RA Abdomen: soft Extremities: R BKA, no edema Dialysis Access: LUE AVG +t/b  Additional Objective Labs: Basic Metabolic Panel: Recent Labs  Lab 11/18/24 1234 11/19/24 0508  NA 138 137  K 4.0 3.9  CL 96* 97*  CO2 28 29  GLUCOSE 204* 117*  BUN 43* 27*  CREATININE 9.07* 6.48*  CALCIUM  9.6 9.2   Liver Function Tests: Recent Labs  Lab 11/18/24 1234  AST 21  ALT 16  ALKPHOS 61  BILITOT 0.3  PROT 7.3  ALBUMIN 3.9   No results for input(s): LIPASE, AMYLASE in the last 168 hours. CBC: Recent Labs  Lab 11/18/24 1234 11/18/24 1421 11/18/24 2051 11/19/24 0508  WBC 8.9 9.0  --  9.2  NEUTROABS 6.2  --   --   --   HGB 6.8* 7.1* 9.5* 8.8*  HCT 21.4* 22.1* 29.5* 26.9*  MCV 104.9* 105.2*  --  98.9  PLT 259 260  --  286   Blood Culture    Component Value Date/Time   SDES  04/21/2020 1641    TOE Performed at [facility], [address] [place], [place] [address]    [facility]  04/21/2020 1641    Immunocompromised Performed at [facility], [address], [place], [place] [address]    CULT  04/21/2020 1641    ABUNDANT METHICILLIN RESISTANT STAPHYLOCOCCUS AUREUS   REPTSTATUS 04/25/2020 FINAL 04/21/2020 1641    Cardiac Enzymes: No results for input(s): CKTOTAL, CKMB, CKMBINDEX, TROPONINI in the last 168 hours. CBG: No results for input(s): GLUCAP in the last 168 hours. Iron Studies: No results for input(s): IRON, TIBC, TRANSFERRIN, FERRITIN in the last 72  hours. @lablastinr3 @ Studies/Results: DG Chest Port 1 View Result Date: 11/18/2024 CLINICAL DATA:  Shortness of breath EXAM: PORTABLE CHEST 1 VIEW COMPARISON:  None Available. FINDINGS: The heart size and mediastinal contours are within normal limits. Both lungs are clear. The visualized skeletal structures are unremarkable. IMPRESSION: No active disease. Electronically Signed   By: Greig Pique M.D.   On: 11/18/2024 14:54   Medications:   sodium chloride    Intravenous Once   amLODipine   10 mg Oral Daily   calcitRIOL   1 mcg Oral Q M,W,F-HD   Chlorhexidine  Gluconate Cloth  6 each Topical Q0600   cinacalcet   60 mg Oral Q M,W,F-HD   [START ON 11/20/2024] darbepoetin (ARANESP ) injection - DIALYSIS  100 mcg Subcutaneous Once   gabapentin   300 mg Oral Q T,Th,Sa-HD   hydrALAZINE   10 mg Intravenous Once   pantoprazole  (PROTONIX ) IV  40 mg Intravenous Q12H    Assessment/Recommendations:   ESRD -outpatient HD orders: [facility] TTS.  4 hours.  EDW 89.5 kg.  LUE AVG 15-gauge.  Flow rates: 300/500.  2K/3 calcium .  Heparin : None.  Meds: Calcitriol  1 mcg with treatment, Sensipar  60 mg with treatment, Mircera 90 mcg (due 11/25), Venofer 50  mg weekly (due this week) -HD on Monday, Wednesday, Saturday (holiday schedule this week) - had HD 11/24, next tomorrow   Anemia of Chronic Kidney Disease Acute on chronic blood loss anemia, GI bleed -Hemoglobin 6.8 now 8.8 after receiving 1u prbc -Resumed ESA, checking iron stores -Transfuse PRN for Hgb <7 - GI bleed management per primary.  Eliquis and Plavix on hold.     Volume/ hypertension  -BP currently acceptable on home meds, UF as tolerated with HD tomorrow   Secondary Hyperparathyroidism/Hyperphosphatemia - On home calcitriol  and Sensipar , checking Phos.  Resumed home binders phoslo    Severe PVD - Eliquis and Plavix on hold per primary team he has a history of chronic limb ischemia likely for angioplasties -outpt nephrologist should consider  non Ca based binder   DM2 - Management per primary   Jesse Barters MD 11/19/2024, 8:03 AM  [facility] Pager: [phone]

## 2024-11-20 ENCOUNTER — Encounter (HOSPITAL_COMMUNITY): Payer: Self-pay | Admitting: Internal Medicine

## 2024-11-20 ENCOUNTER — Telehealth: Payer: Self-pay | Admitting: Gastroenterology

## 2024-11-20 ENCOUNTER — Inpatient Hospital Stay (HOSPITAL_BASED_OUTPATIENT_CLINIC_OR_DEPARTMENT_OTHER): Admitting: Anesthesiology

## 2024-11-20 ENCOUNTER — Inpatient Hospital Stay (HOSPITAL_COMMUNITY): Admitting: Anesthesiology

## 2024-11-20 ENCOUNTER — Encounter (HOSPITAL_COMMUNITY)
Admission: EM | Disposition: A | Discharge status: Home / Self Care | Payer: Self-pay | Source: Ambulatory Visit | Attending: Emergency Medicine

## 2024-11-20 DIAGNOSIS — K259 Gastric ulcer, unspecified as acute or chronic, without hemorrhage or perforation: Secondary | ICD-10-CM | POA: Diagnosis not present

## 2024-11-20 DIAGNOSIS — D509 Iron deficiency anemia, unspecified: Secondary | ICD-10-CM | POA: Diagnosis not present

## 2024-11-20 DIAGNOSIS — K122 Cellulitis and abscess of mouth: Secondary | ICD-10-CM

## 2024-11-20 DIAGNOSIS — K2970 Gastritis, unspecified, without bleeding: Secondary | ICD-10-CM

## 2024-11-20 DIAGNOSIS — F17210 Nicotine dependence, cigarettes, uncomplicated: Secondary | ICD-10-CM

## 2024-11-20 DIAGNOSIS — F17200 Nicotine dependence, unspecified, uncomplicated: Secondary | ICD-10-CM | POA: Diagnosis not present

## 2024-11-20 DIAGNOSIS — K649 Unspecified hemorrhoids: Secondary | ICD-10-CM

## 2024-11-20 DIAGNOSIS — K449 Diaphragmatic hernia without obstruction or gangrene: Secondary | ICD-10-CM

## 2024-11-20 DIAGNOSIS — I10 Essential (primary) hypertension: Secondary | ICD-10-CM

## 2024-11-20 DIAGNOSIS — D62 Acute posthemorrhagic anemia: Secondary | ICD-10-CM | POA: Diagnosis not present

## 2024-11-20 DIAGNOSIS — D649 Anemia, unspecified: Principal | ICD-10-CM | POA: Diagnosis present

## 2024-11-20 DIAGNOSIS — I739 Peripheral vascular disease, unspecified: Secondary | ICD-10-CM | POA: Diagnosis present

## 2024-11-20 DIAGNOSIS — I82409 Acute embolism and thrombosis of unspecified deep veins of unspecified lower extremity: Secondary | ICD-10-CM | POA: Diagnosis present

## 2024-11-20 HISTORY — PX: ESOPHAGOGASTRODUODENOSCOPY: SHX5428

## 2024-11-20 HISTORY — PX: FLEXIBLE SIGMOIDOSCOPY: SHX5431

## 2024-11-20 LAB — BASIC METABOLIC PANEL WITH GFR
Anion gap: 13 (ref 5–15)
BUN: 35 mg/dL — ABNORMAL HIGH (ref 6–20)
CO2: 28 mmol/L (ref 22–32)
Calcium: 9 mg/dL (ref 8.9–10.3)
Chloride: 97 mmol/L — ABNORMAL LOW (ref 98–111)
Creatinine, Ser: 8.27 mg/dL — ABNORMAL HIGH (ref 0.61–1.24)
GFR, Estimated: 7 mL/min — ABNORMAL LOW (ref >60)
Glucose, Bld: 243 mg/dL — ABNORMAL HIGH (ref 70–99)
Potassium: 4.8 mmol/L (ref 3.5–5.1)
Sodium: 137 mmol/L (ref 135–145)

## 2024-11-20 LAB — CBC
HCT: 25.9 % — ABNORMAL LOW (ref 39.0–52.0)
Hemoglobin: 8.4 g/dL — ABNORMAL LOW (ref 13.0–17.0)
MCH: 32.3 pg (ref 26.0–34.0)
MCHC: 32.4 g/dL (ref 30.0–36.0)
MCV: 99.6 fL (ref 80.0–100.0)
Platelets: 260 K/uL (ref 150–400)
RBC: 2.6 MIL/uL — ABNORMAL LOW (ref 4.22–5.81)
RDW: 17.3 % — ABNORMAL HIGH (ref 11.5–15.5)
WBC: 8.7 K/uL (ref 4.0–10.5)
nRBC: 0 % (ref 0.0–0.2)

## 2024-11-20 LAB — GLUCOSE, CAPILLARY
Glucose-Capillary: 209 mg/dL — ABNORMAL HIGH (ref 70–99)
Glucose-Capillary: 140 mg/dL — ABNORMAL HIGH (ref 70–99)

## 2024-11-20 LAB — MAGNESIUM: Magnesium: 2.7 mg/dL — ABNORMAL HIGH (ref 1.7–2.4)

## 2024-11-20 SURGERY — EGD (ESOPHAGOGASTRODUODENOSCOPY)
Anesthesia: Monitor Anesthesia Care

## 2024-11-20 MED ORDER — ACETAMINOPHEN 325 MG PO TABS: 650.0000 mg | ORAL_TABLET | Freq: Four times a day (QID) | ORAL | Status: AC | PRN

## 2024-11-20 MED ORDER — AMLODIPINE BESYLATE 10 MG PO TABS: 10.0000 mg | ORAL_TABLET | Freq: Every day | ORAL | 5 refills | Status: AC

## 2024-11-20 MED ORDER — PROPOFOL 500 MG/50ML IV EMUL
INTRAVENOUS | Status: DC | PRN
  Administered 2024-11-20: 60 mg via INTRAVENOUS
  Administered 2024-11-20: 150 ug/kg/min via INTRAVENOUS
  Administered 2024-11-20: 20 mg via INTRAVENOUS

## 2024-11-20 MED ORDER — PANTOPRAZOLE SODIUM 40 MG PO TBEC: 40.0000 mg | DELAYED_RELEASE_TABLET | Freq: Every day | ORAL | 1 refills | Status: AC

## 2024-11-20 MED ORDER — LIDOCAINE 2% (20 MG/ML) 5 ML SYRINGE
INTRAMUSCULAR | Status: DC | PRN
  Administered 2024-11-20: 100 mg via INTRAVENOUS

## 2024-11-20 MED ORDER — GABAPENTIN 300 MG PO CAPS
300.0000 mg | ORAL_CAPSULE | Freq: Once | ORAL | Status: AC
  Administered 2024-11-20: 300 mg via ORAL
  Filled 2024-11-20: qty 1

## 2024-11-20 MED ORDER — SODIUM CHLORIDE 0.9 % IV SOLN: INTRAVENOUS | Status: DC

## 2024-11-20 MED ORDER — GLYCOPYRROLATE PF 0.2 MG/ML IJ SOSY
PREFILLED_SYRINGE | INTRAMUSCULAR | Status: DC | PRN
  Administered 2024-11-20: .2 mg via INTRAVENOUS

## 2024-11-20 MED ORDER — SODIUM CHLORIDE 0.9 % IV SOLN: INTRAVENOUS | Status: DC | PRN

## 2024-11-20 MED ORDER — PHENYLEPHRINE HCL (PRESSORS) 10 MG/ML IV SOLN
INTRAVENOUS | Status: DC | PRN
  Administered 2024-11-20: 100 ug via INTRAVENOUS

## 2024-11-20 MED ORDER — SITAGLIPTIN PHOSPHATE 25 MG PO TABS: 25.0000 mg | ORAL_TABLET | Freq: Every day | ORAL | 5 refills | Status: AC

## 2024-11-20 NOTE — Telephone Encounter (Signed)
 Dr. Eartha scoped patient while inpatient today. He is requesting referral to ENT, for ulceration and oozing in the hard palate, ulcer had necrotic base. Uvula with redness and swelling.   Please request asap appt.

## 2024-11-20 NOTE — Plan of Care (Signed)
   Problem: Education: Goal: Knowledge of General Education information will improve Description Including pain rating scale, medication(s)/side effects and non-pharmacologic comfort measures Outcome: Progressing

## 2024-11-20 NOTE — Progress Notes (Signed)
 We will proceed with EGD and colonoscopy as scheduled.  I thoroughly discussed with the patient the procedure, including the risks involved. Patient understands what the procedure involves including the benefits and any risks. Patient understands alternatives to the proposed procedure. Risks including (but not limited to) bleeding, tearing of the lining (perforation), rupture of adjacent organs, problems with heart and lung function, infection, and medication reactions. A small percentage of complications may require surgery, hospitalization, repeat endoscopic procedure, and/or transfusion.  Patient understood and agreed.  Katrinka Blazing, MD Gastroenterology and Hepatology [facility]astroenterology

## 2024-11-20 NOTE — Progress Notes (Signed)
 Bowel prep fluids given. Administered two tap water enemas as scheduled. Pt retained for two to five minutes. Expelled clear yellow fluid. Tolerated well, no dark or tarry stools formed. No complications noted.

## 2024-11-20 NOTE — Transfer of Care (Signed)
 Immediate Anesthesia Transfer of Care Note  Patient: Kaylor Ast  Procedure(s) Performed: EGD (ESOPHAGOGASTRODUODENOSCOPY) SIGMOIDOSCOPY, FLEXIBLE  Patient Location: PACU  Anesthesia Type:General  Level of Consciousness: drowsy and patient cooperative  Airway & Oxygen Therapy: Patient Spontanous Breathing and Patient connected to nasal cannula oxygen  Post-op Assessment: Report given to RN and Post -op Vital signs reviewed and stable  Post vital signs: Reviewed and stable  Last Vitals:  Vitals Value Taken Time  BP 124/84   Temp 98.2   Pulse 61 11/20/24 10:05  Resp 11 11/20/24 10:05  SpO2 100 % 11/20/24 10:05  Vitals shown include unfiled device data.  Last Pain:  Vitals:   11/20/24 0936  TempSrc:   PainSc: 7       Patients Stated Pain Goal: 7 (11/20/24 0820)  Complications: No notable events documented.

## 2024-11-20 NOTE — Telephone Encounter (Signed)
 Referral sent as Urgent, they will contact patient with apt

## 2024-11-20 NOTE — Progress Notes (Signed)
 Pt complained of bilateral lower extremity pain. New order placed for one time administration of prn Gabapentin  by Terry LOISE Hurst, DO. Pt tolerated well.

## 2024-11-20 NOTE — Plan of Care (Signed)
  Problem: Clinical Measurements: Goal: Ability to maintain clinical measurements within normal limits will improve Outcome: Progressing Goal: Will remain free from infection Outcome: Progressing   Problem: Activity: Goal: Risk for activity intolerance will decrease Outcome: Progressing   Problem: Coping: Goal: Level of anxiety will decrease Outcome: Progressing   Problem: Elimination: Goal: Will not experience complications related to bowel motility Outcome: Progressing Goal: Will not experience complications related to urinary retention Outcome: Progressing   Problem: Pain Managment: Goal: General experience of comfort will improve and/or be controlled Outcome: Progressing   Problem: Safety: Goal: Ability to remain free from injury will improve Outcome: Progressing   Problem: Skin Integrity: Goal: Risk for impaired skin integrity will decrease Outcome: Progressing

## 2024-11-20 NOTE — Anesthesia Preprocedure Evaluation (Signed)
 Anesthesia Evaluation  Patient identified by MRN, date of birth, ID band Patient awake    Reviewed: Allergy & Precautions, H&P , NPO status , Patient's Chart, lab work & pertinent test results, reviewed documented beta blocker date and time   Airway Mallampati: II  TM Distance: >3 FB Neck ROM: full    Dental no notable dental hx.    Pulmonary neg pulmonary ROS, Current Smoker and Patient abstained from smoking.   Pulmonary exam normal breath sounds clear to auscultation       Cardiovascular Exercise Tolerance: Good hypertension,  Rhythm:regular Rate:Normal     Neuro/Psych negative neurological ROS  negative psych ROS   GI/Hepatic ,GERD  ,,(+) Hepatitis -  Endo/Other  diabetes    Renal/GU Renal disease  negative genitourinary   Musculoskeletal   Abdominal   Peds  Hematology  (+) Blood dyscrasia, anemia   Anesthesia Other Findings   Reproductive/Obstetrics negative OB ROS                              Anesthesia Physical Anesthesia Plan  ASA: 3  Anesthesia Plan: MAC   Post-op Pain Management:    Induction:   PONV Risk Score and Plan: Propofol  infusion  Airway Management Planned:   Additional Equipment:   Intra-op Plan:   Post-operative Plan:   Informed Consent: I have reviewed the patients History and Physical, chart, labs and discussed the procedure including the risks, benefits and alternatives for the proposed anesthesia with the patient or authorized representative who has indicated his/her understanding and acceptance.     Dental Advisory Given  Plan Discussed with: CRNA  Anesthesia Plan Comments:         Anesthesia Quick Evaluation

## 2024-11-20 NOTE — Discharge Summary (Signed)
 Jesse Nguyen, is a 53 y.o. male  DOB 1971-08-05  MRN [id number].  Admission date:  11/18/2024  Admitting Physician  Pratik JONETTA Fairly, DO  Discharge Date:  11/20/2024   Primary MD  Alston Silvio BROCKS, FNP  Recommendations for primary care physician for things to follow:  1) you need referral to ear nose and throat physician /ENT within the next couple of weeks for ulceration and oozing in the hard palate, ulcer had necrotic base. Uvula with redness and swelling.   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  Outpatient follow-up with gastroenterology/GI doctor in 3 to 4 weeks advised  5) your colonoscopy could not be done properly because you had too much stool in your bowel--- you to follow-up with gastroenterology/GI team in 3 to 4 weeks to discuss repeat colonoscopy you will need a 2-day colon prep prior to repeat colonoscopy  6) you have decided to leave today AGAINST MEDICAL ADVICE despite persuasion to the contrary--- you have refused further hemodialysis sessions today  7)Follow-up Gastroenterologist Dr. Carlin Hasty with [facility]--- in 3 to 4 weeks for evaluation  -address: [address], [place], [place] [address], Phone: [phone]  Admission Diagnosis  Melena [K92.1] Acute blood loss anemia [D62] Symptomatic anemia [D64.9]   Discharge Diagnosis  Melena [K92.1] Acute blood loss anemia [D62] Symptomatic anemia [D64.9]    Principal Problem:   Acute blood loss anemia Active Problems:   PAD (peripheral artery disease)--Prior Lt Leg Stent   H/o DVT (deep  venous thrombosis) (HCC)   Symptomatic anemia      Past Medical History:  Diagnosis Date   Diabetes mellitus without complication (HCC)    Dialysis patient    LUE restriction   ESRD (end stage renal disease) (HCC)    GERD (gastroesophageal reflux disease)    Hepatitis C    s/p treatment about 10 years ago while incarcerated   HLD (hyperlipidemia)    Hypertension    Psoriasis     Past Surgical History:  Procedure Laterality Date   A/V SHUNT INTERVENTION N/A 05/23/2024   Procedure: A/V SHUNT INTERVENTION;  Surgeon: Gretta Lonni PARAS, MD;  Location: [facility];  Service: Cardiovascular;  Laterality: N/A;   AV FISTULA PLACEMENT Left 11/22/2022   Procedure: LEFT ARM GRAFT CREATION;  Surgeon: Oris Krystal FALCON, MD;  Location: [facility];  Service: Vascular;  Laterality: Left;   EYE SURGERY     INCISION AND DRAINAGE BURSA / SUB-FASCIA OF FOOT Right 03/02/2022   INTRAOPERATIVE ARTERIOGRAM Left 01/03/2022   PERIPHERAL VASCULAR THROMBECTOMY N/A 05/23/2024   Procedure: PERIPHERAL VASCULAR THROMBECTOMY;  Surgeon: Gretta Lonni PARAS, MD;  Location: [facility];  Service: Cardiovascular;  Laterality: N/A;   TRANSMETATARSAL AMPUTATION Left 01/04/2022  VENOUS ANGIOPLASTY  05/23/2024   Procedure: VENOUS ANGIOPLASTY;  Surgeon: Gretta Lonni PARAS, MD;  Location: [facility];  Service: Cardiovascular;;  Venous Anastomosis       HPI  from the history and physical done on the day of admission:     Expand All Collapse All   History and Physical      Jesse Nguyen FMW:[id number] DOB: 01/28/1971 DOA: 11/18/2024   PCP: Alston Silvio BROCKS, FNP    Patient coming from: Dialysis center   Chief Complaint: Low hemoglobin/weakness   HPI: Jesse Nguyen is a 53 y.o. male with medical history significant for type 2 diabetes with peripheral neuropathy, dyslipidemia, hypertension, ESRD on HD TTS, anemia of CKD, and severe PVD on Eliquis and Plavix who was at dialysis on 11/22 and was noted to have a low hemoglobin  and thus was called by his dialysis facility regarding the need to go to the ED for further evaluation.  He was complaining of weakness for several days as well and denies any complaints of overt bleeding.  He denies any dizziness, abdominal pain, nausea, vomiting, or any other symptoms.  His last dose of Eliquis was this morning.   ED Course: Patient noted to have elevated blood pressure readings in the ED with creatinine 1.07 and hemoglobin 6.8.  FOBT noted to be positive.  1 unit PRBC ordered for transfusion and IV hydralazine  to be given for blood pressure management.  EDP has discussed case with GI and nephrology with nephrology planning for hemodialysis today.   Review of Systems: Reviewed as noted above, otherwise negative.         Hospital Course:     Assessment and Plan: 1)Symptomatic acute on chronic blood loss anemia likely in the setting of GI bleed - Prior hemoglobin levels around 11 noted about 5 months ago, currently 6.8 - -Hgb currently 8.4 after transfusion  -EGD on 11/20/24  shows non-bleeding gastric ulcer with a clean ulcer base (Forrest Class III)--  colonoscopy attempted in the colon prep was very poor with remnant stool in the colon noted-- -outpatient follow-up with gastroenterology/GI team in 3 to 4 weeks to discuss repeat colonoscopy you will need a 2-day colon prep prior to repeat colonoscopy -Continue PPI  2) abnormal findings of the present on EGD--- --patient is advised to get referral to ear nose and throat physician /ENT within the next couple of weeks for ulceration and oozing in the hard palate, ulcer had necrotic base. Uvula with redness and swelling.   3)Left eyelid nodule ----asymptomatic  - referral to ophthalmologist OR dermatologist for palpebral nodule (eyelid nodule) in your left eye area,  4)ESRD--stable, continue ESRD on TTS schedule  4) social/ethics--- patient left AMA refusing further GI or nephrology input at this time -Refused  hemodialysis here in the hospital today -He would like to follow-up with his outpatient hemodialysis center for his usual hemodialysis as outpatient  5)Uncontrolled hypertension Patient left AMA -Continue Coreg - Continue PTA BP meds further adjustment by PCP and nephrologist   6)Severe PVD - Noted to have issues with critical limb ischemia with need for angioplasties - Monitor hemoglobin closely while on Plavix and Eliquis --Patient to follow-up with his vascular surgeon to see if he can be on monotherapy with just Eliquis or Plavix and longer on the above   Discharge Condition: Patient left AMA  Follow UP   Follow-up Information     Okey Burns, MD. Schedule an appointment as soon as possible for a visit in 1  week(s).   Specialty: Otolaryngology Contact information: [address] [place] [address] [phone]         Cindie Carlin POUR, DO. Schedule an appointment as soon as possible for a visit in 3 week(s).   Specialty: Gastroenterology Contact information: [address] [place] [address] [phone]                 Consults obtained -nephrology and GI  Diet and Activity recommendation:  As advised  Discharge Instructions    Discharge Instructions     Ambulatory referral to ENT   Complete by: As directed    Call MD for:  temperature >100.4   Complete by: As directed    Diet - low sodium heart healthy   Complete by: As directed    Diet renal with fluid restriction   Complete by: As directed    Discharge instructions   Complete by: As directed    1) you need referral to ear nose and throat physician /ENT within the next couple of weeks for ulceration and oozing in the hard palate, ulcer had necrotic base. Uvula with redness and swelling.   2) you need referral to ophthalmologist OR dermatologist for palpebral nodule (eyelid nodule) in your left eye area,  3) please continue your hemodialysis per your usual  schedule  4) your upper endoscopy today shows non-bleeding gastric ulcer with a clean ulcer base (Forrest Class III)--you need to take medication like omeprazole Protonix  to have the ulcers heal.  Outpatient follow-up with gastroenterology/GI doctor in 3 to 4 weeks advised  5) your colonoscopy could not be done properly because she had too much stool in your bowel--- you to follow-up with gastroenterology/GI team in 3 to 4 weeks to discuss repeat colonoscopy you will need a 2-day colon prep prior to repeat colonoscopy  6) you have decided to leave today AGAINST MEDICAL ADVICE despite persuasion to the contrary--- you have refused further hemodialysis sessions today  7)Follow-up Gastroenterologist Dr. Carlin Cindie with [facility]--- in 3 to 4 weeks for evaluation  -address: [address], [place], [place] [address], Phone: [phone]  8)Avoid ibuprofen /Advil /Aleve/Motrin Josefine Powders/Naproxen/BC powders/Meloxicam/Diclofenac/Indomethacin and other Nonsteroidal anti-inflammatory medications as these will make you more likely to bleed and can cause stomach ulcers, can also cause Kidney problems.   9)You have had DVT (Deep Vein thrombosis) and peripheral artery disease of your legs with occlusions requiring angioplasty and stent placement--you came you are on Plavix and Eliquis/apixaban--you are at increased risk for bleeding and you have chronic anemia--please talk to your primary care physician and vascular surgeon to see if you continue to be above Plavix/clopidogrel and Eliquis/apixaban   Increase activity slowly   Complete by: As directed         Discharge Medications     Allergies as of 11/20/2024       Reactions   Other Rash   Fragrance soaps, perfumes        Medication List     STOP taking these medications    bismuth subsalicylate 262 MG chewable tablet Commonly known as: PEPTO BISMOL   GOODY HEADACHE PO       TAKE these medications     acetaminophen  325 MG tablet Commonly known as: TYLENOL  Take 2 tablets (650 mg total) by mouth every 6 (six) hours as needed for mild pain (pain score 1-3) or fever (or Fever >/= 101).   amLODipine  10 MG tablet Commonly known as: NORVASC  Take 1 tablet (  10 mg total) by mouth daily. Start taking on: November 21, 2024 What changed:  medication strength how much to take   apixaban 5 MG Tabs tablet Commonly known as: ELIQUIS Take 5 mg by mouth 2 (two) times daily.   calcium  acetate 667 MG capsule Commonly known as: PHOSLO  Take 667 mg by mouth 3 (three) times daily with meals.   carvedilol 12.5 MG tablet Commonly known as: COREG Take 12.5 mg by mouth 2 (two) times daily with a meal.   clopidogrel 75 MG tablet Commonly known as: PLAVIX Take 75 mg by mouth daily.   gabapentin  300 MG capsule Commonly known as: NEURONTIN  Take 300 mg by mouth daily as needed (pain).   pantoprazole  40 MG tablet Commonly known as: Protonix  Take 1 tablet (40 mg total) by mouth daily.   Renvela 800 MG tablet Generic drug: sevelamer carbonate Take 2,400 mg by mouth 3 (three) times daily.   sitaGLIPtin  25 MG tablet Commonly known as: JANUVIA  Take 25 mg by mouth daily.        Major procedures and Radiology Reports - PLEASE review detailed and final reports for all details, in brief -   DG Chest Port 1 View Result Date: 11/18/2024 CLINICAL DATA:  Shortness of breath EXAM: PORTABLE CHEST 1 VIEW COMPARISON:  None Available. FINDINGS: The heart size and mediastinal contours are within normal limits. Both lungs are clear. The visualized skeletal structures are unremarkable. IMPRESSION: No active disease. Electronically Signed   By: Greig Pique M.D.   On: 11/18/2024 14:54    Micro Results   Recent Results (from the past 240 hours)  MRSA Next Gen by PCR, Nasal     Status: None   Collection Time: 11/18/24  7:10 PM   Specimen: Nasal Mucosa; Nasal Swab  Result Value Ref Range Status   MRSA by  PCR Next Gen NOT DETECTED NOT DETECTED Final    Comment: (NOTE) The GeneXpert MRSA Assay (FDA approved for NASAL specimens only), is one component of a comprehensive MRSA colonization surveillance program. It is not intended to diagnose MRSA infection nor to guide or monitor treatment for MRSA infections. Test performance is not FDA approved in patients less than 13 years old. Performed at [facility], [address], [place], [place] [address]     Today   Subjective    Keldric Poyer today has no new complaints  No fever  Or chills   No Nausea, Vomiting or Diarrhea          Patient has been seen and examined prior to discharge   Objective   Blood pressure (!) 133/52, pulse 69, temperature 97.8 F (36.6 C), resp. rate 18, height 6' (1.829 m), weight 89 kg, SpO2 100%.   Intake/Output Summary (Last 24 hours) at 11/20/2024 1401 Last data filed at 11/19/2024 1740 Gross per 24 hour  Intake 240 ml  Output --  Net 240 ml    Exam Gen:- Awake Alert, no acute distress  HEENT:- [place].AT, No sclera icterus Neck-Supple Neck,No JVD,.  Lungs-  CTAB , good air movement bilaterally CV- S1, S2 normal, regular Abd-  +ve B.Sounds, Abd Soft, No tenderness,    Extremity/Skin:- No  edema,   good pulses Psych-affect is appropriate, oriented x3 Neuro-no new focal deficits, no tremors  MSK--Right BKA, left TMA  Lt UE AVG --with bruit and thrill   Data Review   CBC w Diff:  Lab Results  Component Value Date   WBC 8.7 11/20/2024   HGB 8.4 (L)  11/20/2024   HCT 25.9 (L) 11/20/2024   PLT 260 11/20/2024   LYMPHOPCT 19 11/18/2024   MONOPCT 7 11/18/2024   EOSPCT 2 11/18/2024   BASOPCT 1 11/18/2024   CMP:  Lab Results  Component Value Date   NA 137 11/20/2024   K 4.8 11/20/2024   CL 97 (L) 11/20/2024   CO2 28 11/20/2024   BUN 35 (H) 11/20/2024   CREATININE 8.27 (H) 11/20/2024   PROT 7.3 11/18/2024   ALBUMIN 3.9 11/18/2024   BILITOT 0.3 11/18/2024   ALKPHOS 61 11/18/2024   AST  21 11/18/2024   ALT 16 11/18/2024    Total Discharge time is about 33 minutes  Rendall Carwin M.D on 11/20/2024 at 2:01 PM  Go to [email or web address] -  for contact info  [facility] - Office  [phone]

## 2024-11-20 NOTE — Op Note (Signed)
 [facility] Patient Name: Jesse Nguyen Procedure Date: 11/20/2024 9:09 AM MRN: [id number] Date of Birth: Jul 03, 1971 Attending MD: Toribio Fortune , , [id number] CSN: [id number] Age: 53 Admit Type: Inpatient Procedure:                Upper GI endoscopy Indications:              Iron deficiency anemia Providers:                Toribio Fortune, Harlene Lips, Devere Lodge,                            Bascom Blush Referring MD:              Medicines:                Monitored Anesthesia Care Complications:            No immediate complications. Estimated Blood Loss:     Estimated blood loss: none. Procedure:                Pre-Anesthesia Assessment:                           - Prior to the procedure, a History and Physical                            was performed, and patient medications, allergies                            and sensitivities were reviewed. The patient's                            tolerance of previous anesthesia was reviewed.                           - The risks and benefits of the procedure and the                            sedation options and risks were discussed with the                            patient. All questions were answered and informed                            consent was obtained.                           - ASA Grade Assessment: III - A patient with severe                            systemic disease.                           After obtaining informed consent, the endoscope was                            passed under direct vision. Throughout the  procedure, the patient's blood pressure, pulse, and                            oxygen saturations were monitored continuously. The                            [id number] ([id number])Leezm was introduced through the                            mouth, and advanced to the second part of duodenum.                            The upper GI endoscopy was accomplished without                             difficulty. The patient tolerated the procedure                            well. Scope In: 9:43:20 AM Scope Out: 9:49:43 AM Total Procedure Duration: 0 hours 6 minutes 23 seconds  Findings:      There was presence of ulceration with oozing in the hard palate. Ulcer       had a necrotic base.      Uvula was edematous and erythematous.      A 1 cm hiatal hernia was present.      One non-bleeding superficial gastric ulcer with a clean ulcer base       (Forrest Class III) was found in the gastric fundus. The lesion was 5 mm       in largest dimension. Biopsies were taken with a cold forceps for       histology.      The examined duodenum was normal. Impression:               - There was presence of ulceration with oozing in                            the hard palate. Ulcer had.                           - Uvula was edematous and erythematous.                           - 1 cm hiatal hernia.                           - Non-bleeding gastric ulcer with a clean ulcer                            base (Forrest Class III). Biopsied.                           - Normal examined duodenum. Moderate Sedation:      Per Anesthesia Care Recommendation:           - Proceed with colonoscopy                           -  Urgent ENT evaluation.                           - Pantoprazole  40 mg qday.                           - Await pathology results. Procedure Code(s):        --- Professional ---                           [phone], Esophagogastroduodenoscopy, flexible,                            transoral; with biopsy, single or multiple Diagnosis Code(s):        --- Professional ---                           K44.9, Diaphragmatic hernia without obstruction or                            gangrene                           K25.9, Gastric ulcer, unspecified as acute or                            chronic, without hemorrhage or perforation                           D50.9, Iron deficiency anemia,  unspecified CPT copyright 2022 [employer] Association. All rights reserved. The codes documented in this report are preliminary and upon coder review may  be revised to meet current compliance requirements. Toribio Fortune, MD Toribio Fortune,  11/20/2024 9:56:07 AM This report has been signed electronically. Number of Addenda: 0

## 2024-11-20 NOTE — Op Note (Signed)
 [facility] Patient Name: Jesse Nguyen Procedure Date: 11/20/2024 9:07 AM MRN: [id number] Date of Birth: 05-20-1971 Attending MD: Toribio Fortune , , [id number] CSN: [id number] Age: 53 Admit Type: Inpatient Procedure:                Flexible Sigmoidoscopy Indications:              Iron deficiency anemia Providers:                Toribio Fortune, Harlene Lips, Devere Lodge,                            Bascom Blush Referring MD:              Medicines:                Monitored Anesthesia Care Complications:            No immediate complications. Estimated Blood Loss:     Estimated blood loss: none. Procedure:                Pre-Anesthesia Assessment:                           - Prior to the procedure, a History and Physical                            was performed, and patient medications, allergies                            and sensitivities were reviewed. The patient's                            tolerance of previous anesthesia was reviewed.                           - The risks and benefits of the procedure and the                            sedation options and risks were discussed with the                            patient. All questions were answered and informed                            consent was obtained.                           - ASA Grade Assessment: III - A patient with severe                            systemic disease.                           The PCF-HQ190L ([id number]) Peds Colon was introduced                            through the anus and advanced to the the sigmoid  colon. The patient tolerated the procedure well.                            The quality of the bowel preparation was poor. The                            flexible sigmoidoscopy was performed with                            difficulty due to poor bowel prep. Scope In: 9:56:53 AM Scope Out: 9:59:04 AM Total Procedure Duration: 0 hours 2 minutes 11 seconds   Findings:      The perianal and digital rectal examinations were normal.      A moderate amount of stool was found in the rectum, in the sigmoid colon       and in the descending colon, interfering with visualization. Impression:               - Preparation of the colon was poor.                           - Stool in the rectum, in the sigmoid colon and in                            the descending colon.                           - No specimens collected. Moderate Sedation:      Per Anesthesia Care Recommendation:           - Return patient to hospital ward for ongoing care.                           - Resume previous diet.                           - Ok to restart Plavix and Eliquis.                           - Repeat colonoscopy at the next available                            appointment because the bowel preparation was poor.                            Will need to do a 2-day prep. Procedure Code(s):        --- Professional ---                           [phone], Sigmoidoscopy, flexible; diagnostic,                            including collection of specimen(s) by brushing or                            washing, when performed (separate procedure) Diagnosis Code(s):        ---  Professional ---                           D50.9, Iron deficiency anemia, unspecified CPT copyright 2022 American Medical Association. All rights reserved. The codes documented in this report are preliminary and upon coder review may  be revised to meet current compliance requirements. Toribio Fortune, MD Toribio Fortune,  11/20/2024 10:15:52 AM This report has been signed electronically. Number of Addenda: 0

## 2024-11-20 NOTE — Brief Op Note (Signed)
 11/18/2024 - 11/20/2024  9:54 AM  PATIENT:  Jesse Nguyen  53 y.o. male  PRE-OPERATIVE DIAGNOSIS:  acute blood loss anemia with heme positive stool  POST-OPERATIVE DIAGNOSIS:  EGD: gastric ulcer in the fundus, small hiatal hernia COLON:  PROCEDURE:  Procedure(s): COLONOSCOPY (N/A) EGD (ESOPHAGOGASTRODUODENOSCOPY) (N/A)  SURGEON:  Surgeons and Role:    * Eartha Angelia Sieving, MD - Primary  Patient underwent EGD and FLEX SIG under propofol  sedation.  Tolerated the procedure adequately.   EGD FINDINGS: - There was presence of ulceration with oozing in the hard palate. Ulcer had.  - Uvula was edematous and erythematous. - 1 cm hiatal hernia.  - Non-bleeding gastric ulcer with a clean ulcer base (Forrest Class III).  Biopsied.  - Normal examined duodenum.   FLEX SIG FINDINGS: - Preparation of the colon was poor.  - Stool in the rectum, in the sigmoid colon and in the descending colon.   RECOMMENDATIONS - Urgent ENT evaluation. - Pantoprazole  40 mg qday. - Await pathology results.  - Return patient to hospital ward for ongoing care.  - Resume previous diet.  - Ok to restart Plavix and Eliquis. - Repeat colonoscopy at the next available appointment because the bowel preparation was poor. Will need to do a 2-day prep. - Patient will follow up in GI clinic in 3-4 weeks. - GI service will sign-off, please call us  back if you have any more questions.  Sieving Eartha, MD Gastroenterology and Hepatology [facility]

## 2024-11-20 NOTE — Progress Notes (Signed)
 Nephrology note  Unable to see the patient in person today due to his procedure.  Planning for dialysis today.

## 2024-11-20 NOTE — Discharge Instructions (Addendum)
 1) you need referral to ear nose and throat physician /ENT within the next couple of weeks for ulceration and oozing in the hard palate, ulcer had necrotic base. Uvula with redness and swelling.   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  Outpatient follow-up with gastroenterology/GI doctor in 3 to 4 weeks advised  5) your colonoscopy could not be done properly because she had too much stool in your bowel--- you to follow-up with gastroenterology/GI team in 3 to 4 weeks to discuss repeat colonoscopy you will need a 2-day colon prep prior to repeat colonoscopy  6) you have decided to leave today AGAINST MEDICAL ADVICE despite persuasion to the contrary--- you have refused further hemodialysis sessions today  7)Follow-up Gastroenterologist Dr. Carlin Hasty with [facility]--- in 3 to 4 weeks for evaluation  -address: [address] [place], [place] [address], Phone: [phone]  8)Avoid ibuprofen /Advil /Aleve/Motrin Josefine Powders/Naproxen/BC powders/Meloxicam/Diclofenac/Indomethacin and other Nonsteroidal anti-inflammatory medications as these will make you more likely to bleed and can cause stomach ulcers, can also cause Kidney problems.   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

## 2024-11-22 ENCOUNTER — Ambulatory Visit (INDEPENDENT_AMBULATORY_CARE_PROVIDER_SITE_OTHER): Payer: Self-pay | Admitting: Gastroenterology

## 2024-11-22 LAB — HCV RNA QUANT RFLX ULTRA OR GENOTYP
HCV RNA Qnt(log copy/mL): 6.846 {Log_IU}/mL
HepC Qn: 7010000 [IU]/mL

## 2024-11-22 LAB — HEPATITIS C GENOTYPE

## 2024-11-22 LAB — SURGICAL PATHOLOGY

## 2024-11-22 NOTE — Anesthesia Postprocedure Evaluation (Signed)
 Anesthesia Post Note  Patient: Jesse Nguyen  Procedure(s) Performed: EGD (ESOPHAGOGASTRODUODENOSCOPY) SIGMOIDOSCOPY, FLEXIBLE  Patient location during evaluation: Phase II Anesthesia Type: MAC Level of consciousness: awake Pain management: pain level controlled Vital Signs Assessment: post-procedure vital signs reviewed and stable Respiratory status: spontaneous breathing and respiratory function stable Cardiovascular status: blood pressure returned to baseline and stable Postop Assessment: no headache and no apparent nausea or vomiting Anesthetic complications: no Comments: Late entry   No notable events documented.   Last Vitals:  Vitals:   11/20/24 1043 11/20/24 1435  BP: (!) 133/52 131/62  Pulse: 69 66  Resp: 18 18  Temp:  36.8 C  SpO2: 100% 100%    Last Pain:  Vitals:   11/20/24 1435  TempSrc: Oral  PainSc:                  Yvonna JINNY Bosworth

## 2024-11-25 ENCOUNTER — Encounter (HOSPITAL_COMMUNITY): Payer: Self-pay | Admitting: Gastroenterology

## 2024-11-25 NOTE — Progress Notes (Signed)
 Patient result letter mailed

## 2024-11-26 NOTE — Progress Notes (Signed)
 Late note entry 12/2 2:11 Requested yesterday morning 12/1, 9:12am that d/c summary be completed by provider per the request of [facility]. Have updated clinic again that I do not have this available. Will fax over info once it becomes available.   Lavanda Jamaira Sherk Dialysis Navigator [phone]

## 2024-11-29 NOTE — Progress Notes (Signed)
 Late note entry 12/5 1158am D/c summary faxed to [facility].

## 2024-12-12 NOTE — Progress Notes (Signed)
 Situation: Member not enrolled in care management services.   Background: Member identified after outpatient surgery on 12/10/24 at [facility] for left lower extremity arteriogram. High risk [employer] member.   Assessment: Outpatient surgery follow up call completed with member.  Explained nature of the call and role of ACN.  Member left lower extremity arteriogram.  Member reports surgical site is dry and intact.  Reviewed red flags for follow up/discharge instructions from AVS.  Member denies any new or worsening issues.  Member reports that he has had dialysis today and that it was rough.  Member reports that he is tired and requests call back from ACN so he can take a nap.  Member denies any immediate care/SDOH needs at this time.  Member agrees for follow up with ACN.  Recommendation: ACN provided contact info to member during call and encouraged member to call with any needs, questions, or concerns.  ACN will set task for week 2 follow up call with member.   Jeoffrey Dollar, RN, BSN, CCM [facility] [phone]

## 2024-12-17 ENCOUNTER — Ambulatory Visit

## 2024-12-17 NOTE — Progress Notes (Signed)
 " HISTORY AND PHYSICAL     CC:  dialysis access Requesting Provider:  Bucio, Silvio BROCKS, FNP  HPI: This is a 53 y.o. male here for evaluation of his hemodialysis access.    Pt has hx of left FA loop AVG placed 11/22/2022 by Dr. Oris in [place].  Most recently he underwent percutaneous thrombectomy with declot and balloon angioplasty left FA loop AVG including venous anastomosis on 05/23/2024 by Dr. Gretta.   He comes in today for evaluation of a psa on his forearm loop AVG.   He states that when the stick the medial side of the graft, it works well, but if they stick the lateral aspect, the machine alarms and does not run well.  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  He states he has had some oozing from needle sticks at night.  He has hx of PAD that is followed by [facility] at [facility].  He recently underwent arteriogram.  He has hx of RLE amputation.    The pt is right hand dominant.    Pt is on dialysis.   Days of dialysis if applicable:  T/T/S    HD center:  [facility] location in [place].    The pt is not on a statin for cholesterol management.  The pt is not on a daily aspirin.  Other AC:  Plavix/Eliquis The pt is on BB, CCB for hypertension.  The pt is  on medication for diabetes.   Tobacco hx:  current  Past Medical History:  Diagnosis Date   Diabetes mellitus without complication (HCC)    Dialysis patient    LUE restriction   ESRD (end stage renal disease) (HCC)    GERD (gastroesophageal reflux disease)    Hepatitis C    s/p treatment about 10 years ago while incarcerated   HLD (hyperlipidemia)    Hypertension    Psoriasis     Past Surgical History:  Procedure Laterality Date   A/V SHUNT INTERVENTION N/A 05/23/2024   Procedure: A/V SHUNT INTERVENTION;  Surgeon: Gretta Lonni PARAS, MD;  Location: [facility];  Service: Cardiovascular;  Laterality: N/A;   AV FISTULA PLACEMENT Left 11/22/2022   Procedure: LEFT ARM GRAFT CREATION;  Surgeon: Oris Krystal FALCON,  MD;  Location: [facility];  Service: Vascular;  Laterality: Left;   ESOPHAGOGASTRODUODENOSCOPY N/A 11/20/2024   Procedure: EGD (ESOPHAGOGASTRODUODENOSCOPY);  Surgeon: Eartha Flavors, Toribio, MD;  Location: [facility];  Service: Gastroenterology;  Laterality: N/A;   EYE SURGERY     FLEXIBLE SIGMOIDOSCOPY  11/20/2024   Procedure: KINGSTON SIDE;  Surgeon: Eartha Flavors, Toribio, MD;  Location: [facility];  Service: Gastroenterology;;   INCISION AND DRAINAGE BURSA / SUB-FASCIA OF FOOT Right 03/02/2022   INTRAOPERATIVE ARTERIOGRAM Left 01/03/2022   PERIPHERAL VASCULAR THROMBECTOMY N/A 05/23/2024   Procedure: PERIPHERAL VASCULAR THROMBECTOMY;  Surgeon: Gretta Lonni PARAS, MD;  Location: [facility];  Service: Cardiovascular;  Laterality: N/A;   TRANSMETATARSAL AMPUTATION Left 01/04/2022   VENOUS ANGIOPLASTY  05/23/2024   Procedure: VENOUS ANGIOPLASTY;  Surgeon: Gretta Lonni PARAS, MD;  Location: [facility];  Service: Cardiovascular;;  Venous Anastomosis    Allergies[1]  Current Outpatient Medications  Medication Sig Dispense Refill   acetaminophen  (TYLENOL ) 325 MG tablet Take 2 tablets (650 mg total) by mouth every 6 (six) hours as needed for mild pain (pain score 1-3) or fever (or Fever >/= 101).     amLODipine  (NORVASC ) 10 MG tablet Take 1 tablet (10 mg total)  by mouth daily. 30 tablet 5   apixaban (ELIQUIS) 5 MG TABS tablet Take 5 mg by mouth 2 (two) times daily.     calcium  acetate (PHOSLO ) 667 MG capsule Take 667 mg by mouth 3 (three) times daily with meals.     carvedilol (COREG) 12.5 MG tablet Take 12.5 mg by mouth 2 (two) times daily with a meal.     clopidogrel (PLAVIX) 75 MG tablet Take 75 mg by mouth daily.     gabapentin  (NEURONTIN ) 300 MG capsule Take 300 mg by mouth daily as needed (pain).     pantoprazole  (PROTONIX ) 40 MG tablet Take 1 tablet (40 mg total) by mouth daily. 30 tablet 1   RENVELA 800 MG tablet Take 2,400 mg by mouth 3 (three) times daily.      sitaGLIPtin  (JANUVIA ) 25 MG tablet Take 1 tablet (25 mg total) by mouth daily. 20 tablet 5   No current facility-administered medications for this visit.    Family History  Problem Relation Age of Onset   Diabetes Father    Diabetes Sister    Colon cancer Neg Hx    Colon polyps Neg Hx     Social History   Socioeconomic History   Marital status: Single    Spouse name: Not on file   Number of children: Not on file   Years of education: Not on file   Highest education level: Not on file  Occupational History   Not on file  Tobacco Use   Smoking status: Every Day    Current packs/day: 0.15    Types: Cigarettes   Smokeless tobacco: Never  Vaping Use   Vaping status: Never Used  Substance and Sexual Activity   Alcohol use: No   Drug use: Not Currently    Frequency: 7.0 times per week    Types: Marijuana   Sexual activity: Yes  Other Topics Concern   Not on file  Social History Narrative   Not on file   Social Drivers of Health   Tobacco Use: High Risk (11/23/2024)   Received from [facility]   Patient History    Smoking Tobacco Use: Some Days    Smokeless Tobacco Use: Never    Passive Exposure: Never  Financial Resource Strain: Not on file  Food Insecurity: No Food Insecurity (11/18/2024)   Epic    Worried About Programme Researcher, Broadcasting/film/video in the Last Year: Never true    Ran Out of Food in the Last Year: Never true  Transportation Needs: No Transportation Needs (11/18/2024)   Epic    Lack of Transportation (Medical): No    Lack of Transportation (Non-Medical): No  Physical Activity: Not on file  Stress: Not on file  Social Connections: Not on file  Intimate Partner Violence: Not At Risk (11/18/2024)   Epic    Fear of Current or Ex-Partner: No    Emotionally Abused: No    Physically Abused: No    Sexually Abused: No  Depression (PHQ2-9): Not on file  Alcohol Screen: Not on file  Housing: Low Risk (11/18/2024)   Epic    Unable to Pay for Housing in the Last  Year: No    Number of Times Moved in the Last Year: 0    Homeless in the Last Year: No  Utilities: Not At Risk (11/18/2024)   Epic    Threatened with loss of utilities: No  Health Literacy: Not on file     ROS: [x]  Positive   [ ]   Negative   [ ]  All sytems reviewed and are negative  Cardiac: []  chest pain/pressure []  SOB/DOE  Vascular: [x]  PAD [x]  hx DVT on Eliquis  Pulmonary: []  asthma []  wheezing  Neurologic: []  hx CVA/TIA  Hematologic: []  bleeding problems  GI []  GERD  GU: [x]  CKD/renal failure  [x]  HD---[]  M/W/F [x]  T/T/S  Psychiatric: []  hx of major depression  Integumentary: []  rashes []  ulcers  Constitutional: []  fever []  chills   PHYSICAL EXAMINATION:  Today's Vitals   12/18/24 0902  BP: (!) 176/85  Pulse: 76  Resp: 18  Temp: 97.9 F (36.6 C)  TempSrc: Temporal  SpO2: 99%  Weight: 203 lb (92.1 kg)  Height: 6' (1.829 m)  PainSc: 7    Body mass index is 27.53 kg/m.    General:  WDWN male in NAD Gait: Not observed HENT: WNL Pulmonary: normal non-labored breathing  Cardiac: regular Abdomen: soft, NT Skin: without rashes Vascular Exam/Pulses:   Right Left  Radial 2+ (normal) 2+ (normal)   Extremities:  superficial needle stick on lateral edge.  This is not adhered to the graft and I am able to lift it off the graft.  He does have some aneurysmal areas on the lateral aspect.  The forearm graft is pulsatile.   Musculoskeletal: no muscle wasting or atrophy  Neurologic: A&O X 3    ASSESSMENT/PLAN: 53 y.o. male with hx ESRD and hx of left FA loop AVG.  Most recently he underwent percutaneous thrombectomy with declot and balloon angioplasty left FA loop AVG including venous anastomosis on 05/23/2024 by Dr. Gretta.   He comes in today for evaluation of his forearm loop AVG.    -the left RA loop graft is pulsatile and the facility is only able to stick on the medial side.  Given the above, we will set him up for a fistulogram to evaluate  his access.  We will try to get this done on Monday, a non dialysis day.   -pt is on dialysis on T/T/S -discussed with pt that access does not last forever and will need intervention or even new access at some point.  He expressed good understanding -pt is right hand dominant -pt is on anticoagulation-Eliquis and this would need to be held 3 days prior.     Lucie Apt, [facility] [phone]  Clinic MD:   Serene on call MD     [1]  Allergies Allergen Reactions   Other Rash    Fragrance soaps, perfumes   "

## 2024-12-18 ENCOUNTER — Encounter: Payer: Self-pay | Admitting: Physician Assistant

## 2024-12-18 ENCOUNTER — Ambulatory Visit: Attending: Vascular Surgery | Admitting: Physician Assistant

## 2024-12-18 VITALS — BP 176/85 | HR 76 | Temp 97.9°F | Resp 18 | Ht 72.0 in | Wt 203.0 lb

## 2024-12-18 DIAGNOSIS — N186 End stage renal disease: Secondary | ICD-10-CM | POA: Insufficient documentation

## 2024-12-18 DIAGNOSIS — Z992 Dependence on renal dialysis: Secondary | ICD-10-CM | POA: Insufficient documentation

## 2024-12-23 ENCOUNTER — Encounter (HOSPITAL_COMMUNITY): Payer: Self-pay | Admitting: Vascular Surgery

## 2024-12-27 ENCOUNTER — Institutional Professional Consult (permissible substitution) (INDEPENDENT_AMBULATORY_CARE_PROVIDER_SITE_OTHER)

## 2024-12-27 ENCOUNTER — Ambulatory Visit (HOSPITAL_COMMUNITY): Admission: RE | Admit: 2024-12-27 | Source: Home / Self Care | Admitting: Vascular Surgery

## 2024-12-27 ENCOUNTER — Encounter (HOSPITAL_COMMUNITY): Admission: RE | Payer: Self-pay | Source: Home / Self Care

## 2024-12-27 SURGERY — A/V FISTULAGRAM
Anesthesia: LOCAL | Laterality: Left

## 2025-02-05 ENCOUNTER — Ambulatory Visit: Admitting: Gastroenterology
